# Patient Record
Sex: Female | Born: 1935 | Race: Black or African American | Hispanic: No
Health system: Midwestern US, Community
[De-identification: ages and names within clinical notes are randomized; demographics above are authoritative.]

## PROBLEM LIST (undated history)

## (undated) DIAGNOSIS — R4182 Altered mental status, unspecified: Secondary | ICD-10-CM

## (undated) DIAGNOSIS — E785 Hyperlipidemia, unspecified: Secondary | ICD-10-CM

## (undated) DIAGNOSIS — I10 Essential (primary) hypertension: Secondary | ICD-10-CM

## (undated) DIAGNOSIS — R921 Mammographic calcification found on diagnostic imaging of breast: Secondary | ICD-10-CM

## (undated) DIAGNOSIS — E119 Type 2 diabetes mellitus without complications: Secondary | ICD-10-CM

## (undated) DIAGNOSIS — H409 Unspecified glaucoma: Secondary | ICD-10-CM

## (undated) DIAGNOSIS — C50419 Malignant neoplasm of upper-outer quadrant of unspecified female breast: Principal | ICD-10-CM

## (undated) DIAGNOSIS — J189 Pneumonia, unspecified organism: Secondary | ICD-10-CM

## (undated) HISTORY — DX: Mammographic calcification found on diagnostic imaging of breast: R92.1

## (undated) HISTORY — DX: Hyperlipidemia, unspecified: E78.5

## (undated) HISTORY — PX: TONSILLECTOMY: SUR1361

## (undated) HISTORY — DX: Essential (primary) hypertension: I10

## (undated) HISTORY — DX: Malignant neoplasm of upper-outer quadrant of unspecified female breast: C50.419

---
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
## 1978-12-09 HISTORY — PX: BREAST BIOPSY: SHX20

## 1981-12-09 HISTORY — PX: ABDOMINAL HYSTERECTOMY: SHX81

## 2006-04-24 ENCOUNTER — Ambulatory Visit: Payer: Self-pay | Admitting: Orthopedic Surgery

## 2006-05-15 ENCOUNTER — Ambulatory Visit: Payer: Self-pay | Admitting: Orthopedic Surgery

## 2008-12-09 HISTORY — PX: ROTATOR CUFF REPAIR: SHX139

## 2009-06-29 ENCOUNTER — Ambulatory Visit: Payer: Self-pay | Admitting: Orthopedic Surgery

## 2009-06-29 DIAGNOSIS — E119 Type 2 diabetes mellitus without complications: Secondary | ICD-10-CM

## 2009-06-29 DIAGNOSIS — M719 Bursopathy, unspecified: Secondary | ICD-10-CM

## 2009-06-29 DIAGNOSIS — M25519 Pain in unspecified shoulder: Secondary | ICD-10-CM

## 2009-06-29 DIAGNOSIS — Z8679 Personal history of other diseases of the circulatory system: Secondary | ICD-10-CM | POA: Insufficient documentation

## 2009-06-29 DIAGNOSIS — M25529 Pain in unspecified elbow: Secondary | ICD-10-CM | POA: Insufficient documentation

## 2009-06-29 DIAGNOSIS — M67919 Unspecified disorder of synovium and tendon, unspecified shoulder: Secondary | ICD-10-CM | POA: Insufficient documentation

## 2009-06-30 ENCOUNTER — Encounter (INDEPENDENT_AMBULATORY_CARE_PROVIDER_SITE_OTHER): Payer: Self-pay | Admitting: *Deleted

## 2009-07-06 ENCOUNTER — Encounter: Payer: Self-pay | Admitting: Orthopedic Surgery

## 2009-07-20 ENCOUNTER — Ambulatory Visit: Payer: Self-pay | Admitting: Orthopedic Surgery

## 2009-07-26 ENCOUNTER — Telehealth: Payer: Self-pay | Admitting: Orthopedic Surgery

## 2009-08-04 ENCOUNTER — Ambulatory Visit (HOSPITAL_COMMUNITY): Admission: RE | Admit: 2009-08-04 | Discharge: 2009-08-05 | Payer: Self-pay | Admitting: Orthopedic Surgery

## 2009-08-04 ENCOUNTER — Ambulatory Visit: Payer: Self-pay | Admitting: Orthopedic Surgery

## 2009-08-08 ENCOUNTER — Ambulatory Visit: Payer: Self-pay | Admitting: Orthopedic Surgery

## 2009-08-09 ENCOUNTER — Encounter (INDEPENDENT_AMBULATORY_CARE_PROVIDER_SITE_OTHER): Payer: Self-pay | Admitting: *Deleted

## 2009-08-10 ENCOUNTER — Encounter: Payer: Self-pay | Admitting: Orthopedic Surgery

## 2009-08-15 ENCOUNTER — Ambulatory Visit: Payer: Self-pay | Admitting: Orthopedic Surgery

## 2009-09-13 ENCOUNTER — Ambulatory Visit: Payer: Self-pay | Admitting: Orthopedic Surgery

## 2009-09-21 ENCOUNTER — Encounter: Payer: Self-pay | Admitting: Orthopedic Surgery

## 2009-10-04 ENCOUNTER — Encounter: Payer: Self-pay | Admitting: Orthopedic Surgery

## 2009-10-31 ENCOUNTER — Ambulatory Visit: Payer: Self-pay | Admitting: Orthopedic Surgery

## 2009-11-16 ENCOUNTER — Encounter: Payer: Self-pay | Admitting: Orthopedic Surgery

## 2010-01-12 ENCOUNTER — Encounter: Payer: Self-pay | Admitting: Orthopedic Surgery

## 2011-01-08 NOTE — Letter (Signed)
Summary: Medical records request [facility]  Medical records request [facility]   Imported By: Cammie Sickle 01/16/2010 16:46:59  _____________________________________________________________________  External Attachment:    Type:   Image     Comment:   External Document

## 2011-03-16 LAB — GLUCOSE, CAPILLARY
Glucose-Capillary: 126 mg/dL — ABNORMAL HIGH (ref 70–99)
Glucose-Capillary: 130 mg/dL — ABNORMAL HIGH (ref 70–99)
Glucose-Capillary: 77 mg/dL (ref 70–99)

## 2011-03-16 LAB — BASIC METABOLIC PANEL
BUN: 12 mg/dL (ref 6–23)
Calcium: 9.6 mg/dL (ref 8.4–10.5)
GFR calc non Af Amer: >60 mL/min (ref >60)
Potassium: 3.9 mEq/L (ref 3.5–5.1)

## 2011-03-16 LAB — HEMOGLOBIN AND HEMATOCRIT, BLOOD: Hemoglobin: 13.1 g/dL (ref 12.0–15.0)

## 2011-04-23 NOTE — Op Note (Signed)
NAME:  [name], [name]                  ACCOUNT NO.:  [id number]   MEDICAL RECORD NO.:  [id number]          PATIENT TYPE:  OIB   LOCATION:  A331                          FACILITY:  [facility]   PHYSICIAN:  Vickki Hearing, M.D.DATE OF BIRTH:  01/05/1936   DATE OF PROCEDURE:  08/04/2009  DATE OF DISCHARGE:                               OPERATIVE REPORT   HISTORY:  [name] is a 75 year old.  She injured her shoulder on July  16, while on vacation in [place], she fell.  She presented to the office  with pain in her left shoulder.  She could not move her arm, had weak  supraspinatus tendon function.  She went for MRI showed a full-thickness  supraspinatus tendon tear.  We recommended surgery versus continued  nonoperative treatment.  We did try an injection as well which did not  help.   PREOPERATIVE DIAGNOSIS:  Left shoulder rotator cuff tear.   POSTOPERATIVE DIAGNOSIS:  Left shoulder rotator cuff tear.   PROCEDURE:  Open rotator cuff repair.   SURGEON:  Vickki Hearing, MD   ASSISTED:  Hartshorne Nation.   ANESTHESIA:  General.   OPERATIVE FINDINGS:  Supraspinatus tendon was torn.  The size was 2 cm.  The retraction was 1 cm.   Implants used two 6/5 suture anchors and one 4/5 push lock anchor.   DETAILS OF PROCEDURE:  The patient was identified in the preop holding  area.  Surgical site marking was performed.  Chart was updated.  The  patient was taken to surgery, given vancomycin which was started in  preop.  She underwent general anesthesia successfully.  She was placed  in the modified beach-chair position.  Her left arm was prepped with  chlorhexidine and draped sterilely.  Time-out procedure was completed.   An incision was made over the anterolateral acromion taken down to the  deltoid fascia.  The deltoid fascia was split in line with the skin  incision and then the split was carried up subperiosteally over the  acromion.  There was a loose piece of anterior acromion  which was  removed and then acromioplasty was performed.   A bursectomy was then performed.  The tendon was encountered.  It was  very mobile, not scarred down of all.  Two traction sutures were placed.  We then burred the articular margin junction with the greater tuberosity  as well as the greater tuberosity.  Then placed two 6/5 suture anchors,  removed one of the sutures, passed two sutures in a horizontal mattress  fashion, and then passed one push lock anchor with all 4 sutures in the  push lock.  This gave an excellent repair with no tension.  The repair  was done with the arm at the side.  The wound was then irrigated and  closed with #2 Ethibond to close the deltoid occluding a reattachment to  the acromion and then 0 Monocryl and  subcu running 3-0 nylon.  Pain pump catheter was placed.  Subacromial  injection of Marcaine was done.  Steri-Strips were applied.  Sterile  dressing cryo cuff.  The patient was extubated and taken to recovery  room in stable condition.  She will be admitted for pain control.      Vickki Hearing, M.D.  Electronically Signed     SEH/MEDQ  D:  08/04/2009  T:  08/04/2009  Job:  [id number]

## 2011-04-23 NOTE — Discharge Summary (Signed)
NAMEBREYANNA, [name]                  ACCOUNT NO.:  [id number]   MEDICAL RECORD NO.:  [id number]          PATIENT TYPE:  OIB   LOCATION:  A331                          FACILITY:  [facility]   PHYSICIAN:  Vickki Hearing, M.D.DATE OF BIRTH:  01/05/1936   DATE OF ADMISSION:  08/04/2009  DATE OF DISCHARGE:  08/28/2010LH                               DISCHARGE SUMMARY   ADMITTING DIAGNOSIS:  Left rotator cuff tear.   DISCHARGE DIAGNOSIS:  Left rotator cuff tear.   ADMITTING PHYSICIAN:  Vickki Hearing, MD   DISCHARGING PHYSICIAN:  Vickki Hearing, MD   OPERATIVE PROCEDURE:  Open rotator cuff repair, left shoulder.   SURGEON:  Vickki Hearing, MD   OPERATIVE FINDINGS:  Complete supraspinatus tendon tear 2 cm in size and  1-cm retracted.   The patient was admitted on the 27th for same-day surgery, had the  surgery, did well, was admitted to the floor for pain control, did well  with that, and was discharged home with following medications:  1. Celebrex 200 mg daily.  2. Vasotec 10 mg daily.  3. Zetia 10 mg daily.  4. Xalatan eye solution 1 drop both eyes nightly.  5. Glucophage 500 mg p.o. daily w.c.  6. One therapeutic multivitamin.  7. Actos 45 mg daily.  8. Maxzide 1 tablet daily.  9. Robaxin 500 mg p.o. q.6 p.r.n. pain.  10.Darvocet 1 tablet q.4 p.r.n. pain.   ALLERGIES:  Allergic to AMOXICILLIN.   The patient will be discharged in a cryo cuff with a pain pump catheter  in place.  The patient will follow with me on Tuesday.   OVERALL CONDITION:  Stable.   DISPOSITION:  To home.      Vickki Hearing, M.D.  Electronically Signed     SEH/MEDQ  D:  08/05/2009  T:  08/05/2009  Job:  [id number]

## 2011-08-13 ENCOUNTER — Other Ambulatory Visit: Payer: Self-pay | Admitting: Internal Medicine

## 2011-08-13 DIAGNOSIS — R921 Mammographic calcification found on diagnostic imaging of breast: Secondary | ICD-10-CM

## 2011-08-14 ENCOUNTER — Other Ambulatory Visit: Payer: Self-pay | Admitting: Internal Medicine

## 2011-08-15 ENCOUNTER — Other Ambulatory Visit: Payer: Self-pay | Admitting: Internal Medicine

## 2011-08-15 DIAGNOSIS — N63 Unspecified lump in unspecified breast: Secondary | ICD-10-CM

## 2011-08-21 ENCOUNTER — Ambulatory Visit
Admission: RE | Admit: 2011-08-21 | Discharge: 2011-08-21 | Disposition: A | Discharge status: Home / Self Care | Payer: Medicare Other | Source: Ambulatory Visit | Attending: Internal Medicine | Admitting: Internal Medicine

## 2011-08-21 ENCOUNTER — Other Ambulatory Visit: Payer: Self-pay | Admitting: Internal Medicine

## 2011-08-21 DIAGNOSIS — N63 Unspecified lump in unspecified breast: Secondary | ICD-10-CM

## 2011-08-21 DIAGNOSIS — R921 Mammographic calcification found on diagnostic imaging of breast: Secondary | ICD-10-CM

## 2011-08-26 ENCOUNTER — Ambulatory Visit
Admission: RE | Admit: 2011-08-26 | Discharge: 2011-08-26 | Disposition: A | Discharge status: Home / Self Care | Payer: Medicare Other | Source: Ambulatory Visit | Attending: Internal Medicine | Admitting: Internal Medicine

## 2011-08-26 ENCOUNTER — Other Ambulatory Visit: Payer: Self-pay | Admitting: Internal Medicine

## 2011-08-26 DIAGNOSIS — R921 Mammographic calcification found on diagnostic imaging of breast: Secondary | ICD-10-CM

## 2011-09-05 ENCOUNTER — Ambulatory Visit (INDEPENDENT_AMBULATORY_CARE_PROVIDER_SITE_OTHER): Payer: Self-pay | Admitting: Surgery

## 2011-09-05 ENCOUNTER — Telehealth (INDEPENDENT_AMBULATORY_CARE_PROVIDER_SITE_OTHER): Payer: Self-pay | Admitting: General Surgery

## 2011-09-05 ENCOUNTER — Encounter (INDEPENDENT_AMBULATORY_CARE_PROVIDER_SITE_OTHER): Payer: Self-pay | Admitting: Surgery

## 2011-09-05 NOTE — Telephone Encounter (Signed)
Pt called office under the impression she was having a punch biopsy performed at 9/28 appt. Advised pt that was not the appointment scheduled. Pt stated she received a call and it may have been the breast center upstairs on 4th floor. Advised pt her appt was an evaluation only.

## 2011-09-06 ENCOUNTER — Ambulatory Visit (INDEPENDENT_AMBULATORY_CARE_PROVIDER_SITE_OTHER): Payer: Medicare Other | Admitting: Surgery

## 2011-09-06 ENCOUNTER — Other Ambulatory Visit (INDEPENDENT_AMBULATORY_CARE_PROVIDER_SITE_OTHER): Payer: Self-pay | Admitting: Surgery

## 2011-09-06 ENCOUNTER — Encounter (INDEPENDENT_AMBULATORY_CARE_PROVIDER_SITE_OTHER): Payer: Self-pay | Admitting: Surgery

## 2011-09-06 VITALS — BP 134/86 | HR 60 | Temp 97.3°F | Resp 16 | Ht 61.5 in | Wt 207.4 lb

## 2011-09-06 DIAGNOSIS — R921 Mammographic calcification found on diagnostic imaging of breast: Secondary | ICD-10-CM

## 2011-09-06 DIAGNOSIS — R928 Other abnormal and inconclusive findings on diagnostic imaging of breast: Secondary | ICD-10-CM

## 2011-09-06 NOTE — Progress Notes (Signed)
Chief Complaint  Patient presents with  . Other    new pt- eval rt br calcifications    HPI [name] is a 75 y.o. female.  She was found to have some breast calcifications in the right breast. A recent followup mammogram showed some change in these calcifications. A core biopsy was attempted, but unsuccessful because the calcifications were too far posterior. Surgical excision was recommended.  The patient is having no breast symptoms whatsoever. Around 1980 she had a surgical biopsy in the right breast for some benign process. She does not recall any of the details. HPI  Past Medical History  Diagnosis Date  . Hypertension   . Hyperlipidemia   . Breast calcifications     right    Past Surgical History  Procedure Date  . Appendectomy 1954  . Abdominal hysterectomy 1983  . Rotator cuff repair 2010    left  . Breast biopsy 1980    right breast    Family History  Problem Relation Age of Onset  . Stroke Mother   . Heart disease Father   . Cancer Sister     breast    Social History History  Substance Use Topics  . Smoking status: Never Smoker   . Smokeless tobacco: Not on file  . Alcohol Use: No    Allergies  Allergen Reactions  . Amoxicillin     Current Outpatient Prescriptions  Medication Sig Dispense Refill  . aspirin (BAYER ASPIRIN) 325 MG tablet Take 325 mg by mouth daily.        . bimatoprost (LUMIGAN) 0.01 % SOLN 1 drop at bedtime.        . enalapril (VASOTEC) 10 MG tablet Take 10 mg by mouth daily.        . pioglitazone (ACTOS) 45 MG tablet Take 45 mg by mouth daily.        . rosuvastatin (CRESTOR) 40 MG tablet Take 40 mg by mouth daily.        Marland Kitchen triamterene-hydrochlorothiazide (DYAZIDE) 37.5-25 MG per capsule Take 1 capsule by mouth every morning.          Review of Systems Review of Systems She has filled out our 12 point review of systems and it is negative.She does have HBP, Hi Chol, and eye problems   Blood pressure 134/86, pulse 60,  temperature 97.3 F (36.3 C), temperature source Temporal, resp. rate 16, height 5' 1.5" (1.562 m), weight 207 lb 6.4 oz (94.076 kg).  Physical Exam Physical Exam GENERAL:  The patient is alert, oriented, and generally healthy-appearing, NAD. Mood and affect are normal.  HEENT:  The head is normocephalic, the eyes nonicteric, the pupils were round regular and equal. EOMs are normal. Pharynx normal. Dentition good.  NECK:  The neck is supple and there are no masses or thyromegaly.  LUNGS: Normal respirations and clear to auscultation.  HEART: Regular rhythm, with no murmurs rubs or gallops. Pulses are intact carotid dorsalis pedis and posterior tibial. No significant varicosities are noted.  BREASTS:  The breasts are symmetric in appearance. There is no palpable mass on either side. The skin nipple and areolar areas are normal. There is a well-healed surgical scar in the right breast upper outer quadrant. There is nothing that would suggest a mass.  LYMPHATICS: There is no axillary or supraclavicular adenopathy.  ABDOMEN: Soft, flat, and nontender. No masses or organomegaly is noted. No hernias are noted. Bowel sounds are normal.  EXTREMITIES:  Good range of motion, no edema.  Data Reviewed I have reviewed the mammogram films and reports. I've also looked at her other medical history information.  Assessment    Indeterminate right breast calcifications    Plan    NL Excisional biopsy. I have discussed indications risks and complications of the surgery with the patient and her daughter and son. All questions have been answered. They would like to go ahead and schedule surgery.        [name] J 09/06/2011, 11:33 AM

## 2011-09-06 NOTE — Patient Instructions (Signed)
We will schedule surgery for the removal of the breast calcifications that were found on your recent mammogram.  You will need to have a guidewire placed the morning of surgery so we can be able to find these calcifications.  Her surgery will be done as an outpatient.  You Need to Stop Your Aspirin Five Days before Surgery. You May Resume It the Day after Surgery.

## 2011-09-23 ENCOUNTER — Telehealth (INDEPENDENT_AMBULATORY_CARE_PROVIDER_SITE_OTHER): Payer: Self-pay | Admitting: General Surgery

## 2011-09-23 ENCOUNTER — Other Ambulatory Visit (INDEPENDENT_AMBULATORY_CARE_PROVIDER_SITE_OTHER): Payer: Self-pay | Admitting: Surgery

## 2011-09-23 ENCOUNTER — Encounter (HOSPITAL_COMMUNITY)
Admission: RE | Admit: 2011-09-23 | Discharge: 2011-09-23 | Disposition: A | Discharge status: Home / Self Care | Payer: Medicare Other | Source: Ambulatory Visit | Attending: Surgery | Admitting: Surgery

## 2011-09-23 DIAGNOSIS — R921 Mammographic calcification found on diagnostic imaging of breast: Secondary | ICD-10-CM

## 2011-09-23 LAB — BASIC METABOLIC PANEL
Calcium: 10.6 mg/dL — ABNORMAL HIGH (ref 8.4–10.5)
Creatinine, Ser: 0.57 mg/dL (ref 0.50–1.10)
GFR calc non Af Amer: 90 mL/min — ABNORMAL LOW (ref >90)
Sodium: 140 mEq/L (ref 135–145)

## 2011-09-23 LAB — SURGICAL PCR SCREEN
MRSA, PCR: NEGATIVE
Staphylococcus aureus: NEGATIVE

## 2011-09-23 LAB — CBC
MCH: 31.2 pg (ref 26.0–34.0)
MCHC: 33.7 g/dL (ref 30.0–36.0)
MCV: 92.4 fL (ref 78.0–100.0)
Platelets: 295 10*3/uL (ref 150–400)
RDW: 14 % (ref 11.5–15.5)

## 2011-09-23 NOTE — Telephone Encounter (Signed)
Labs okay for surgery faxed to pre-op.  

## 2011-09-23 NOTE — Telephone Encounter (Signed)
Message copied by Liliana Cline on Mon Sep 23, 2011  1:35 PM ------      Message from: Currie Paris      Created: Mon Sep 23, 2011 12:39 PM       These labs are OK for surgery

## 2011-09-24 ENCOUNTER — Ambulatory Visit
Admission: RE | Admit: 2011-09-24 | Discharge: 2011-09-24 | Disposition: A | Discharge status: Home / Self Care | Payer: Medicare Other | Source: Ambulatory Visit | Attending: Surgery | Admitting: Surgery

## 2011-09-24 ENCOUNTER — Encounter (INDEPENDENT_AMBULATORY_CARE_PROVIDER_SITE_OTHER): Payer: Self-pay | Admitting: Surgery

## 2011-09-24 ENCOUNTER — Ambulatory Visit (HOSPITAL_COMMUNITY)
Admission: RE | Admit: 2011-09-24 | Discharge: 2011-09-24 | Disposition: A | Discharge status: Home / Self Care | Payer: Medicare Other | Source: Ambulatory Visit | Attending: Surgery | Admitting: Surgery

## 2011-09-24 ENCOUNTER — Other Ambulatory Visit (INDEPENDENT_AMBULATORY_CARE_PROVIDER_SITE_OTHER): Payer: Self-pay | Admitting: Surgery

## 2011-09-24 DIAGNOSIS — Z01812 Encounter for preprocedural laboratory examination: Secondary | ICD-10-CM | POA: Insufficient documentation

## 2011-09-24 DIAGNOSIS — C50419 Malignant neoplasm of upper-outer quadrant of unspecified female breast: Secondary | ICD-10-CM

## 2011-09-24 DIAGNOSIS — R921 Mammographic calcification found on diagnostic imaging of breast: Secondary | ICD-10-CM

## 2011-09-24 DIAGNOSIS — I10 Essential (primary) hypertension: Secondary | ICD-10-CM | POA: Insufficient documentation

## 2011-09-24 DIAGNOSIS — C50919 Malignant neoplasm of unspecified site of unspecified female breast: Secondary | ICD-10-CM | POA: Insufficient documentation

## 2011-09-24 DIAGNOSIS — Z01810 Encounter for preprocedural cardiovascular examination: Secondary | ICD-10-CM | POA: Insufficient documentation

## 2011-09-24 DIAGNOSIS — E119 Type 2 diabetes mellitus without complications: Secondary | ICD-10-CM | POA: Insufficient documentation

## 2011-09-24 DIAGNOSIS — Z01818 Encounter for other preprocedural examination: Secondary | ICD-10-CM | POA: Insufficient documentation

## 2011-09-24 DIAGNOSIS — D0590 Unspecified type of carcinoma in situ of unspecified breast: Secondary | ICD-10-CM

## 2011-09-24 HISTORY — DX: Malignant neoplasm of upper-outer quadrant of unspecified female breast: C50.419

## 2011-09-24 HISTORY — PX: BREAST LUMPECTOMY W/ NEEDLE LOCALIZATION: SHX1266

## 2011-09-24 NOTE — Op Note (Signed)
  [name], [name]                  ACCOUNT NO.:  [id number]  MEDICAL RECORD NO.:  [id number]  LOCATION:  SDSC                         FACILITY:  [facility]  PHYSICIAN:  Currie Paris, M.D.DATE OF BIRTH:  01/05/1936  DATE OF PROCEDURE:  09/24/2011 DATE OF DISCHARGE:                              OPERATIVE REPORT   PREOPERATIVE DIAGNOSIS:  Right breast calcifications not amenable to core biopsy.  POSTOPERATIVE DIAGNOSIS:  Right breast calcifications not amenable to core biopsy.  PROCEDURE:  Needle-guided excision of right breast calcifications.  SURGEON:  Currie Paris, MD  ASSISTANT:  Marveen Reeks, PA student.  ANESTHESIA:  General.  CLINICAL HISTORY:  This is a 75 year old lady with some moderately suspicious right breast calcifications fairly deep and lateral in the right breast and not amenable to a core biopsy.  Elective excisional biopsy using guidewire localization technique was recommended.  DESCRIPTION OF PROCEDURE:  The patient was seen in the holding area. She had no further questions.  We confirmed the surgery as noted above. I initialed the right breast as the operative side.  I reviewed the localizing films and there were 2 guidewires going from lateral to medial and they entered fairly far posteriorly.  The patient was then taken to the operating room.  After satisfactory general anesthesia had been obtained, the breast was prepped and draped and the time-out was done.  I made a transverse incision starting medial to the more medial or more anterior of the 2 guidewires and extending to the more posterior guidewire.  I divided about a centimeter of subcutaneous tissue and then grasped the tract of the guidewire with 2 Allis clamps and took a wide excision around them.  As I got to where I thought I was beyond both of the wires, I start to come down from anterior to posterior, but saw that I had not gotten completely past the more anterior of the 2  wires.  I therefore re-grasped the tissue and took some additional tissue to beyond the tip of the guidewire.  I did have to switch the specimen together to make sure that where I had initially made a tentative decision to separate the specimen from the breast did not appear to be a false margin.  I then inked the specimen, we did special mammography and the calcifications appeared to be contained within the specimen.  I then spent several minutes irrigating cauterizing and suturing bleeders to make sure everything was dry.  I put some Marcaine to help with postop analgesia.  I closed in layers with 3-0 Vicryl, 4-0 Monocryl, subcuticular plus Dermabond.  The patient tolerated the procedure well.  There were no complications. All counts were correct.     Currie Paris, M.D.     CJS/MEDQ  D:  09/24/2011  T:  09/24/2011  Job:  [id number]  Electronically Signed by Cyndia Bent M.D. on 09/24/2011 08:21:26 PM

## 2011-09-27 ENCOUNTER — Telehealth (INDEPENDENT_AMBULATORY_CARE_PROVIDER_SITE_OTHER): Payer: Self-pay | Admitting: General Surgery

## 2011-09-27 NOTE — Telephone Encounter (Signed)
Patient daughter called looking for path results. Made her aware this has not been reviewed by Dr Jamey Ripa and we would call once this has been reviewed.

## 2011-09-30 ENCOUNTER — Telehealth (INDEPENDENT_AMBULATORY_CARE_PROVIDER_SITE_OTHER): Payer: Self-pay | Admitting: Surgery

## 2011-09-30 NOTE — Telephone Encounter (Signed)
Reviewed her path with her today and told her we would see about getting oncology appointments set up for her in [place].

## 2011-10-01 ENCOUNTER — Telehealth (INDEPENDENT_AMBULATORY_CARE_PROVIDER_SITE_OTHER): Payer: Self-pay | Admitting: General Surgery

## 2011-10-01 DIAGNOSIS — C50919 Malignant neoplasm of unspecified site of unspecified female breast: Secondary | ICD-10-CM

## 2011-10-01 NOTE — Telephone Encounter (Signed)
Message copied by Liliana Cline on Tue Oct 01, 2011 11:38 AM ------      Message from: Currie Paris      Created: Tue Oct 01, 2011 10:36 AM       She will need a referral sent to the cancer center for med and rad oncology app'ts . Se wants to be seen in [place] and needs morning appointment time

## 2011-10-10 ENCOUNTER — Ambulatory Visit (INDEPENDENT_AMBULATORY_CARE_PROVIDER_SITE_OTHER): Payer: Medicare Other | Admitting: Surgery

## 2011-10-10 ENCOUNTER — Encounter (HOSPITAL_BASED_OUTPATIENT_CLINIC_OR_DEPARTMENT_OTHER): Payer: Self-pay | Admitting: *Deleted

## 2011-10-10 ENCOUNTER — Encounter (INDEPENDENT_AMBULATORY_CARE_PROVIDER_SITE_OTHER): Payer: Self-pay | Admitting: Surgery

## 2011-10-10 VITALS — BP 122/70 | HR 76 | Temp 97.2°F | Resp 20 | Ht 61.5 in | Wt 207.0 lb

## 2011-10-10 DIAGNOSIS — C50419 Malignant neoplasm of upper-outer quadrant of unspecified female breast: Secondary | ICD-10-CM

## 2011-10-10 NOTE — Patient Instructions (Signed)
We will schedule you for another excision of the area of your breast cancer and appointments in [place] with a medical and radiation oncologist

## 2011-10-10 NOTE — Progress Notes (Signed)
ALL NOTES BY C RATHBONE RN  DONE UNDER Burns Spain RN

## 2011-10-10 NOTE — Progress Notes (Signed)
NAME: [name]                                            DOB: 01/05/1936 DATE: 10/10/2011                                                  MRN: [id number]  CC: post op excisional breast Bx  HPI: This patient comes in for post op follow-up.Sheunderwent NL excision of right breast calcifications on 09/24/2011. She feels that she is doing well.  PE: General: The patient appears to be healthy, NAD Incision healing nicely - no infection or hematoma  DATA REVIEWED: Path shows IDC, receptor positve with 1 cm marign, but associated DCIS with 1mm medial margin  IMPRESSION: The patient is doing well S/P NL lumpectom.    PLAN: She will need re-excision for margin. I went over the path report with her and family. She was presented at the breast conference and no SLN indicated, possibly no radiation depending on final path

## 2011-10-13 NOTE — H&P (Signed)
Progress Notes [name] (MR# [id number])         Progress Notes Info       Author  Note Status  Last Update User  Last Update Date/Time    Currie Paris, MD  Signed  Currie Paris, MD  10/10/2011 12:36 PM          Progress Notes     NAME: [name] DOB: 01/05/1936  DATE: 10/10/2011 MRN: [id number]  CC: post op excisional breast Bx  HPI:  This patient comes in for post op follow-up.Sheunderwent NL excision of right breast calcifications on 09/24/2011. She feels that she is doing well.  PE:  General: The patient appears to be healthy, NAD  Incision healing nicely - no infection or hematoma  DATA REVIEWED:  Path shows IDC, receptor positve with 1 cm marign, but associated DCIS with 1mm medial margin  IMPRESSION:  The patient is doing well S/P NL lumpectom.  PLAN:  She will need re-excision for margin. I went over the path report with her and family. She was presented at the breast conference and no SLN indicated, possibly no radiation depending on final path          Progress Notes   [name] (MR# [id number])       Progress Notes Info       Author Note Status Last Update User Last Update Date/Time    Currie Paris, MD Signed Currie Paris, MD 09/06/2011 11:38 AM      Progress Notes     Chief Complaint   Patient presents with   .  Other       new pt- eval rt br calcifications        HPI [name] is a 75 y.o. female.  She was found to have some breast calcifications in the right breast. A recent followup mammogram showed some change in these calcifications. A core biopsy was attempted, but unsuccessful because the calcifications were too far posterior. Surgical excision was recommended.   The patient is having no breast symptoms whatsoever. Around 1980 she had a surgical biopsy in the right breast for some benign process. She does not recall any of the details. HPI    Past Medical History   Diagnosis  Date     .  Hypertension     .  Hyperlipidemia     .  Breast calcifications         right         Past Surgical History   Procedure  Date   .  Appendectomy  1954   .  Abdominal hysterectomy  1983   .  Rotator cuff repair  2010       left   .  Breast biopsy  1980       right breast         Family History   Problem  Relation  Age of Onset   .  Stroke  Mother     .  Heart disease  Father     .  Cancer  Sister         breast        Social History History   Substance Use Topics   .  Smoking status:  Never Smoker    .  Smokeless tobacco:  Not on file   .  Alcohol Use:  No  Allergies   Allergen  Reactions   .  Amoxicillin           Current Outpatient Prescriptions   Medication  Sig  Dispense  Refill   .  aspirin (BAYER ASPIRIN) 325 MG tablet  Take 325 mg by mouth daily.           .  bimatoprost (LUMIGAN) 0.01 % SOLN  1 drop at bedtime.           .  enalapril (VASOTEC) 10 MG tablet  Take 10 mg by mouth daily.           .  pioglitazone (ACTOS) 45 MG tablet  Take 45 mg by mouth daily.           .  rosuvastatin (CRESTOR) 40 MG tablet  Take 40 mg by mouth daily.           Marland Kitchen  triamterene-hydrochlorothiazide (DYAZIDE) 37.5-25 MG per capsule  Take 1 capsule by mouth every morning.                Review of Systems Review of Systems She has filled out our 12 point review of systems and it is negative.She does have HBP, Hi Chol, and eye problems     Blood pressure 134/86, pulse 60, temperature 97.3 F (36.3 C), temperature source Temporal, resp. rate 16, height 5' 1.5" (1.562 m), weight 207 lb 6.4 oz (94.076 kg).   Physical Exam Physical Exam GENERAL:   The patient is alert, oriented, and generally healthy-appearing, NAD. Mood and affect are normal.   HEENT:   The head is normocephalic, the eyes nonicteric, the pupils were round regular and equal. EOMs are normal. Pharynx normal. Dentition good.   NECK:   The neck is supple and there are no masses or  thyromegaly.   LUNGS: Normal respirations and clear to auscultation.   HEART: Regular rhythm, with no murmurs rubs or gallops. Pulses are intact carotid dorsalis pedis and posterior tibial. No significant varicosities are noted.   BREASTS:  The breasts are symmetric in appearance. There is no palpable mass on either side. The skin nipple and areolar areas are normal. There is a well-healed surgical scar in the right breast upper outer quadrant. There is nothing that would suggest a mass.   LYMPHATICS: There is no axillary or supraclavicular adenopathy.   ABDOMEN: Soft, flat, and nontender. No masses or organomegaly is noted. No hernias are noted. Bowel sounds are normal.   EXTREMITIES:   Good range of motion, no edema.     Data Reviewed I have reviewed the mammogram films and reports. I've also looked at her other medical history information.   Assessment    Indeterminate right breast calcifications     Plan    NL Excisional biopsy. I have discussed indications risks and complications of the surgery with the patient and her daughter and son. All questions have been answered. They would like to go ahead and schedule surgery.            Sunni Richardson J 09/06/2011, 11:33 AM

## 2011-10-14 ENCOUNTER — Encounter (HOSPITAL_BASED_OUTPATIENT_CLINIC_OR_DEPARTMENT_OTHER): Payer: Self-pay | Admitting: Anesthesiology

## 2011-10-14 ENCOUNTER — Encounter (HOSPITAL_BASED_OUTPATIENT_CLINIC_OR_DEPARTMENT_OTHER): Payer: Self-pay | Admitting: *Deleted

## 2011-10-14 ENCOUNTER — Encounter (HOSPITAL_BASED_OUTPATIENT_CLINIC_OR_DEPARTMENT_OTHER): Payer: Self-pay

## 2011-10-14 ENCOUNTER — Ambulatory Visit (HOSPITAL_BASED_OUTPATIENT_CLINIC_OR_DEPARTMENT_OTHER)
Admission: RE | Admit: 2011-10-14 | Discharge: 2011-10-14 | Disposition: A | Discharge status: Home / Self Care | Payer: Medicare Other | Source: Ambulatory Visit | Attending: Surgery | Admitting: Surgery

## 2011-10-14 ENCOUNTER — Encounter (HOSPITAL_BASED_OUTPATIENT_CLINIC_OR_DEPARTMENT_OTHER)
Admission: RE | Disposition: A | Discharge status: Home / Self Care | Payer: Self-pay | Source: Ambulatory Visit | Attending: Surgery

## 2011-10-14 ENCOUNTER — Ambulatory Visit (HOSPITAL_BASED_OUTPATIENT_CLINIC_OR_DEPARTMENT_OTHER): Payer: Medicare Other | Admitting: Anesthesiology

## 2011-10-14 ENCOUNTER — Other Ambulatory Visit (INDEPENDENT_AMBULATORY_CARE_PROVIDER_SITE_OTHER): Payer: Self-pay | Admitting: Surgery

## 2011-10-14 DIAGNOSIS — R920 Mammographic microcalcification found on diagnostic imaging of breast: Secondary | ICD-10-CM | POA: Insufficient documentation

## 2011-10-14 DIAGNOSIS — E119 Type 2 diabetes mellitus without complications: Secondary | ICD-10-CM | POA: Insufficient documentation

## 2011-10-14 DIAGNOSIS — C50919 Malignant neoplasm of unspecified site of unspecified female breast: Secondary | ICD-10-CM

## 2011-10-14 DIAGNOSIS — I10 Essential (primary) hypertension: Secondary | ICD-10-CM | POA: Insufficient documentation

## 2011-10-14 HISTORY — PX: BREAST CYST EXCISION: SHX579

## 2011-10-14 HISTORY — DX: Pneumonia, unspecified organism: J18.9

## 2011-10-14 LAB — POCT I-STAT, CHEM 8
BUN: 10 mg/dL (ref 6–23)
Creatinine, Ser: 0.7 mg/dL (ref 0.50–1.10)
Potassium: 3.7 mEq/L (ref 3.5–5.1)
Sodium: 140 mEq/L (ref 135–145)

## 2011-10-14 SURGERY — EXCISION, CYST, BREAST
Anesthesia: General | Laterality: Right

## 2011-10-14 MED ORDER — FENTANYL CITRATE 0.05 MG/ML IJ SOLN: 25.0000 ug | INTRAMUSCULAR | Status: DC | PRN

## 2011-10-14 MED ORDER — PROPOFOL 10 MG/ML IV EMUL
INTRAVENOUS | Status: DC | PRN
  Administered 2011-10-14: 200 mg via INTRAVENOUS

## 2011-10-14 MED ORDER — IBUPROFEN 200 MG PO TABS: 200.0000 mg | ORAL_TABLET | Freq: Four times a day (QID) | ORAL | Status: DC | PRN

## 2011-10-14 MED ORDER — GLYCOPYRROLATE 0.2 MG/ML IJ SOLN: 0.2000 mg | Freq: Once | INTRAMUSCULAR | Status: DC | PRN

## 2011-10-14 MED ORDER — CIPROFLOXACIN IN D5W 400 MG/200ML IV SOLN
400.0000 mg | INTRAVENOUS | Status: AC
  Administered 2011-10-14: 400 mg via INTRAVENOUS

## 2011-10-14 MED ORDER — MIDAZOLAM HCL 2 MG/ML PO SYRP: 0.5000 mg/kg | ORAL_SOLUTION | Freq: Once | ORAL | Status: DC

## 2011-10-14 MED ORDER — EPHEDRINE SULFATE 50 MG/ML IJ SOLN
INTRAMUSCULAR | Status: DC | PRN
  Administered 2011-10-14: 7.5 mg via INTRAVENOUS

## 2011-10-14 MED ORDER — LACTATED RINGERS IV SOLN
INTRAVENOUS | Status: DC | PRN
  Administered 2011-10-14 (×2): via INTRAVENOUS

## 2011-10-14 MED ORDER — MORPHINE SULFATE 2 MG/ML IJ SOLN: 0.0500 mg/kg | INTRAMUSCULAR | Status: DC | PRN

## 2011-10-14 MED ORDER — LACTATED RINGERS IV SOLN: 500.0000 mL | INTRAVENOUS | Status: DC

## 2011-10-14 MED ORDER — MIDAZOLAM HCL 2 MG/2ML IJ SOLN: 0.5000 mg | INTRAMUSCULAR | Status: DC | PRN

## 2011-10-14 MED ORDER — ONDANSETRON HCL 4 MG/2ML IJ SOLN
INTRAMUSCULAR | Status: DC | PRN
  Administered 2011-10-14: 4 mg via INTRAVENOUS

## 2011-10-14 MED ORDER — FENTANYL CITRATE 0.05 MG/ML IJ SOLN
INTRAMUSCULAR | Status: DC | PRN
  Administered 2011-10-14: 25 ug via INTRAVENOUS
  Administered 2011-10-14: 50 ug via INTRAVENOUS

## 2011-10-14 MED ORDER — ATROPINE SULFATE 0.4 MG/ML IJ SOLN: 0.4000 mg | Freq: Once | INTRAMUSCULAR | Status: DC | PRN

## 2011-10-14 MED ORDER — BUPIVACAINE HCL (PF) 0.25 % IJ SOLN
INTRAMUSCULAR | Status: DC | PRN
  Administered 2011-10-14: 30 mL

## 2011-10-14 MED ORDER — KETOROLAC TROMETHAMINE 30 MG/ML IJ SOLN: 15.0000 mg | Freq: Once | INTRAMUSCULAR | Status: DC | PRN

## 2011-10-14 MED ORDER — SODIUM CHLORIDE 0.9 % IV SOLN: 0.1000 mg/kg | Freq: Once | INTRAVENOUS | Status: DC | PRN

## 2011-10-14 MED ORDER — METOCLOPRAMIDE HCL 5 MG/ML IJ SOLN: 10.0000 mg | Freq: Once | INTRAMUSCULAR | Status: DC | PRN

## 2011-10-14 MED ORDER — LIDOCAINE HCL (CARDIAC) 20 MG/ML IV SOLN
INTRAVENOUS | Status: DC | PRN
  Administered 2011-10-14: 100 mg via INTRAVENOUS

## 2011-10-14 MED ORDER — MIDAZOLAM HCL 2 MG/2ML IJ SOLN: 1.0000 mg | INTRAMUSCULAR | Status: DC | PRN

## 2011-10-14 MED ORDER — LACTATED RINGERS IV SOLN
INTRAVENOUS | Status: DC
  Administered 2011-10-14: 1000 mL via INTRAVENOUS

## 2011-10-14 MED ORDER — LIDOCAINE-PRILOCAINE 2.5-2.5 % EX CREA: 1.0000 "application " | TOPICAL_CREAM | Freq: Once | CUTANEOUS | Status: DC

## 2011-10-14 MED ORDER — DEXAMETHASONE SODIUM PHOSPHATE 4 MG/ML IJ SOLN
INTRAMUSCULAR | Status: DC | PRN
  Administered 2011-10-14: 4 mg via INTRAVENOUS

## 2011-10-14 MED ORDER — ACETAMINOPHEN 10 MG/ML IV SOLN
1000.0000 mg | Freq: Once | INTRAVENOUS | Status: AC
  Administered 2011-10-14: 1000 mg via INTRAVENOUS

## 2011-10-14 MED ORDER — ACETAMINOPHEN 100 MG/ML PO SOLN: 15.0000 mg/kg | ORAL | Status: DC | PRN

## 2011-10-14 MED ORDER — OXYMETAZOLINE HCL 0.05 % NA SOLN: 2.0000 | Freq: Once | NASAL | Status: DC

## 2011-10-14 MED ORDER — LACTATED RINGERS IV SOLN: INTRAVENOUS | Status: DC

## 2011-10-14 MED ORDER — ACETAMINOPHEN 80 MG RE SUPP: 20.0000 mg/kg | RECTAL | Status: DC | PRN

## 2011-10-14 MED ORDER — HYDROMORPHONE HCL 2 MG PO TABS: 2.0000 mg | ORAL_TABLET | ORAL | Status: AC | PRN

## 2011-10-14 SURGICAL SUPPLY — 37 items
ADH SKN CLS APL DERMABOND .7 (GAUZE/BANDAGES/DRESSINGS) ×1
BINDER BREAST XLRG (GAUZE/BANDAGES/DRESSINGS) ×1 IMPLANT
BLADE HEX COATED 2.75 (ELECTRODE) ×1 IMPLANT
BLADE SURG 15 STRL LF DISP TIS (BLADE) IMPLANT
BLADE SURG 15 STRL SS (BLADE) ×2
CANISTER SUCTION 1200CC (MISCELLANEOUS) ×1 IMPLANT
CHLORAPREP W/TINT 26ML (MISCELLANEOUS) ×1 IMPLANT
CLIP TI WIDE RED SMALL 6 (CLIP) ×1 IMPLANT
CLOTH BEACON ORANGE TIMEOUT ST (SAFETY) ×1 IMPLANT
COVER MAYO STAND STRL (DRAPES) ×1 IMPLANT
COVER TABLE BACK 60X90 (DRAPES) ×1 IMPLANT
DERMABOND ADVANCED (GAUZE/BANDAGES/DRESSINGS) ×1
DERMABOND ADVANCED .7 DNX12 (GAUZE/BANDAGES/DRESSINGS) IMPLANT
DEVICE DUBIN W/COMP PLATE 8390 (MISCELLANEOUS) ×1 IMPLANT
DRAPE LAPAROTOMY TRNSV 102X78 (DRAPE) ×1 IMPLANT
DRAPE UTILITY XL STRL (DRAPES) ×1 IMPLANT
ELECT REM PT RETURN 9FT ADLT (ELECTROSURGICAL) ×2
ELECTRODE REM PT RTRN 9FT ADLT (ELECTROSURGICAL) IMPLANT
GLOVE EUDERMIC 7 POWDERFREE (GLOVE) ×1 IMPLANT
GOWN PREVENTION PLUS XLARGE (GOWN DISPOSABLE) ×1 IMPLANT
GOWN PREVENTION PLUS XXLARGE (GOWN DISPOSABLE) ×1 IMPLANT
KIT MARKER MARGIN INK (KITS) ×1 IMPLANT
NDL HYPO 25X1 1.5 SAFETY (NEEDLE) IMPLANT
NEEDLE HYPO 25X1 1.5 SAFETY (NEEDLE) ×2 IMPLANT
NS IRRIG 1000ML POUR BTL (IV SOLUTION) ×1 IMPLANT
PACK BASIN DAY SURGERY FS (CUSTOM PROCEDURE TRAY) ×1 IMPLANT
PENCIL BUTTON HOLSTER BLD 10FT (ELECTRODE) ×1 IMPLANT
SLEEVE SCD COMPRESS KNEE MED (MISCELLANEOUS) ×1 IMPLANT
SPONGE GAUZE 4X4 12PLY (GAUZE/BANDAGES/DRESSINGS) ×1 IMPLANT
SPONGE LAP 4X18 X RAY DECT (DISPOSABLE) ×1 IMPLANT
STAPLER VISISTAT 35W (STAPLE) ×1 IMPLANT
SUT MNCRL AB 4-0 PS2 18 (SUTURE) ×1 IMPLANT
SUT VICRYL 3-0 CR8 SH (SUTURE) ×1 IMPLANT
SYR CONTROL 10ML LL (SYRINGE) ×1 IMPLANT
TOWEL OR NON WOVEN STRL DISP B (DISPOSABLE) ×1 IMPLANT
TUBE CONNECTING 20X1/4 (TUBING) ×1 IMPLANT
YANKAUER SUCT BULB TIP NO VENT (SUCTIONS) ×1 IMPLANT

## 2011-10-14 NOTE — Op Note (Signed)
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 Unfortunately, pathology showed carcinoma with a close medial margin. After lengthy discussion with the patient we elected to proceed to a reexcision of the medial margin.  Procedure: The patient was seen in the holding area and we reviewed the plans for the procedure risks and complications. All questions were answered. I reviewed the chart. I marked her right breast as the operative side.  The patient was taken to the operating room and after satisfactory general anesthesia had been obtained the right breast was prepped and draped. A time out was performed.  I extended the prior incision medially. I only opened about the medial half of the prior incision. About 1.5 cm deep I entered the seroma cavity and evacuated back. I then took approximately a 1.5 cm thick portion of tissue from the medial portion of the biopsy cavity to include a little bit of both the superior and inferior portions of the cavity. I went down as deep as the breast extended.At the end we were as far medial as the nipple.  I spent several minutes here gating and making sure everything was dry. I then injected approximately 30 cc of 0.25% plain Marcaine to help with postop analgesia. A year again began. I put clips in the mark the new medial margin. I closed the deeper layers as I could with some 3-0 Vicryl but ended up leaving something of the cavity to form again. The subcutaneous tissues were closed with 3-0 Vicryl as well. Skin was closed with 4-0 Monocryl subcuticular plus Dermabond. The specimen was labeled with ink shows an enlarged side and sent to  pathology.  The patient tolerated the procedure well. There was minimal blood loss. All counts were correct.

## 2011-10-14 NOTE — H&P (View-Only) (Signed)
          Progress Notes Arielys E Quezada (MR# [id number])         Progress Notes Info       Author  Note Status  Last Update User  Last Update Date/Time    Kahne Helfand J Nzinga Ferran, MD  Signed  Somer Trotter J Octavie Westerhold, MD  10/10/2011 12:36 PM          Progress Notes     NAME: [name] DOB: 01/05/1936  DATE: 10/10/2011 MRN: [id number]  CC: post op excisional breast Bx  HPI:  This patient comes in for post op follow-up.Sheunderwent NL excision of right breast calcifications on 09/24/2011. She feels that she is doing well.  PE:  General: The patient appears to be healthy, NAD  Incision healing nicely - no infection or hematoma  DATA REVIEWED:  Path shows IDC, receptor positve with 1 cm marign, but associated DCIS with 1mm medial margin  IMPRESSION:  The patient is doing well S/P NL lumpectom.  PLAN:  She will need re-excision for margin. I went over the path report with her and family. She was presented at the breast conference and no SLN indicated, possibly no radiation depending on final path          Progress Notes   Kloi E Heidecker (MR# [id number])       Progress Notes Info       Author Note Status Last Update User Last Update Date/Time    Manahil Vanzile J Nashia Remus, MD Signed Paisly Fingerhut J Kyian Obst, MD 09/06/2011 11:38 AM      Progress Notes     Chief Complaint   Patient presents with   .  Other       new pt- eval rt br calcifications        HPI [name] is a 75 y.o. female.  She was found to have some breast calcifications in the right breast. A recent followup mammogram showed some change in these calcifications. A core biopsy was attempted, but unsuccessful because the calcifications were too far posterior. Surgical excision was recommended.   The patient is having no breast symptoms whatsoever. Around 1980 she had a surgical biopsy in the right breast for some benign process. She does not recall any of the details. HPI    Past Medical History   Diagnosis  Date     .  Hypertension     .  Hyperlipidemia     .  Breast calcifications         right         Past Surgical History   Procedure  Date   .  Appendectomy  1954   .  Abdominal hysterectomy  1983   .  Rotator cuff repair  2010       left   .  Breast biopsy  1980       right breast         Family History   Problem  Relation  Age of Onset   .  Stroke  Mother     .  Heart disease  Father     .  Cancer  Sister         breast        Social History History   Substance Use Topics   .  Smoking status:  Never Smoker    .  Smokeless tobacco:  Not on file   .  Alcohol Use:  No           Allergies   Allergen  Reactions   .  Amoxicillin           Current Outpatient Prescriptions   Medication  Sig  Dispense  Refill   .  aspirin (BAYER ASPIRIN) 325 MG tablet  Take 325 mg by mouth daily.           .  bimatoprost (LUMIGAN) 0.01 % SOLN  1 drop at bedtime.           .  enalapril (VASOTEC) 10 MG tablet  Take 10 mg by mouth daily.           .  pioglitazone (ACTOS) 45 MG tablet  Take 45 mg by mouth daily.           .  rosuvastatin (CRESTOR) 40 MG tablet  Take 40 mg by mouth daily.           .  triamterene-hydrochlorothiazide (DYAZIDE) 37.5-25 MG per capsule  Take 1 capsule by mouth every morning.                Review of Systems Review of Systems She has filled out our 12 point review of systems and it is negative.She does have HBP, Hi Chol, and eye problems     Blood pressure 134/86, pulse 60, temperature 97.3 F (36.3 C), temperature source Temporal, resp. rate 16, height 5' 1.5" (1.562 m), weight 207 lb 6.4 oz (94.076 kg).   Physical Exam Physical Exam GENERAL:   The patient is alert, oriented, and generally healthy-appearing, NAD. Mood and affect are normal.   HEENT:   The head is normocephalic, the eyes nonicteric, the pupils were round regular and equal. EOMs are normal. Pharynx normal. Dentition good.   NECK:   The neck is supple and there are no masses or  thyromegaly.   LUNGS: Normal respirations and clear to auscultation.   HEART: Regular rhythm, with no murmurs rubs or gallops. Pulses are intact carotid dorsalis pedis and posterior tibial. No significant varicosities are noted.   BREASTS:  The breasts are symmetric in appearance. There is no palpable mass on either side. The skin nipple and areolar areas are normal. There is a well-healed surgical scar in the right breast upper outer quadrant. There is nothing that would suggest a mass.   LYMPHATICS: There is no axillary or supraclavicular adenopathy.   ABDOMEN: Soft, flat, and nontender. No masses or organomegaly is noted. No hernias are noted. Bowel sounds are normal.   EXTREMITIES:   Good range of motion, no edema.     Data Reviewed I have reviewed the mammogram films and reports. I've also looked at her other medical history information.   Assessment    Indeterminate right breast calcifications     Plan    NL Excisional biopsy. I have discussed indications risks and complications of the surgery with the patient and her daughter and son. All questions have been answered. They would like to go ahead and schedule surgery.            Delante Karapetyan J 09/06/2011, 11:33 AM             

## 2011-10-14 NOTE — Anesthesia Postprocedure Evaluation (Signed)
  Anesthesia Post-op Note  Patient: [name]  Procedure(s) Performed:  CYST EXCISION BREAST - Re-excision of right breast cancer  Patient Location: PACU  Anesthesia Type: General  Level of Consciousness: awake and alert   Airway and Oxygen Therapy: Patient Spontanous Breathing  Post-op Pain: mild  Post-op Assessment: Post-op Vital signs reviewed, Patient's Cardiovascular Status Stable, Respiratory Function Stable, Patent Airway, No signs of Nausea or vomiting, Adequate PO intake and Pain level controlled  Post-op Vital Signs: Reviewed and stable  Complications: No apparent anesthesia complications

## 2011-10-14 NOTE — Anesthesia Procedure Notes (Addendum)
Performed by: Meyer Russel   Procedure Name: LMA Insertion Date/Time: 10/14/2011 1:31 PM Performed by: Meyer Russel Pre-anesthesia Checklist: Patient identified, Timeout performed, Emergency Drugs available, Suction available and Patient being monitored Patient Re-evaluated:Patient Re-evaluated prior to inductionOxygen Delivery Method: Circle System Utilized Preoxygenation: Pre-oxygenation with 100% oxygen Intubation Type: IV induction Ventilation: Mask ventilation without difficulty LMA: LMA with gastric port inserted LMA Size: 4.0 Number of attempts: 1 Placement Confirmation: positive ETCO2 and breath sounds checked- equal and bilateral Tube secured with: Tape Dental Injury: Teeth and Oropharynx as per pre-operative assessment

## 2011-10-14 NOTE — Transfer of Care (Signed)
Immediate Anesthesia Transfer of Care Note  Patient: [name]  Procedure(s) Performed:  CYST EXCISION BREAST - Re-excision of right breast cancer  Patient Location: PACU  Anesthesia Type: General  Level of Consciousness: awake and alert   Airway & Oxygen Therapy: Patient Spontanous Breathing and Patient connected to face mask oxygen  Post-op Assessment: Report given to PACU RN, Post -op Vital signs reviewed and stable and Patient moving all extremities X 4  Post vital signs: Reviewed and stable  Complications: No apparent anesthesia complications

## 2011-10-14 NOTE — Anesthesia Preprocedure Evaluation (Addendum)
Anesthesia Evaluation  Patient identified by MRN, date of birth, ID band Patient awake    Reviewed: Allergy & Precautions, H&P , NPO status , Patient's Chart, lab work & pertinent test results, reviewed documented beta blocker date and time   Airway Mallampati: II TM Distance: >3 FB Neck ROM: full    Dental No notable dental hx.    Pulmonary neg pulmonary ROS, pneumonia ,    Pulmonary exam normal       Cardiovascular hypertension, On Medications and On Home Beta Blockers     Neuro/Psych Negative Neurological ROS  Negative Psych ROS   GI/Hepatic negative GI ROS, Neg liver ROS,   Endo/Other  Diabetes mellitus-, Well Controlled, Type obesity  Renal/GU negative Renal ROS  Genitourinary negative   Musculoskeletal   Abdominal   Peds  Hematology negative hematology ROS (+)   Anesthesia Other Findings   Reproductive/Obstetrics negative OB ROS                          Anesthesia Physical Anesthesia Plan  ASA: III  Anesthesia Plan: General   Post-op Pain Management:    Induction: Intravenous  Airway Management Planned: LMA  Additional Equipment:   Intra-op Plan:   Post-operative Plan: Extubation in OR  Informed Consent: I have reviewed the patients History and Physical, chart, labs and discussed the procedure including the risks, benefits and alternatives for the proposed anesthesia with the patient or authorized representative who has indicated his/her understanding and acceptance.   Dental Advisory Given  Plan Discussed with: CRNA and Surgeon  Anesthesia Plan Comments:       Anesthesia Quick Evaluation

## 2011-10-14 NOTE — Interval H&P Note (Signed)
There have been no changes since the prior H&P. The exam is unchanged. We have reviewed the plans for surgery and all questions answered. I have marked the right breast as the operative side

## 2011-10-17 ENCOUNTER — Telehealth (INDEPENDENT_AMBULATORY_CARE_PROVIDER_SITE_OTHER): Payer: Self-pay | Admitting: General Surgery

## 2011-10-17 NOTE — Telephone Encounter (Signed)
Message copied by Liliana Cline on Thu Oct 17, 2011 11:57 AM ------      Message from: Currie Paris      Created: Thu Oct 17, 2011 11:04 AM       Tell the patient that her margins are OK no more cancer. I will discuss in detail in the office.

## 2011-10-17 NOTE — Telephone Encounter (Signed)
Patient aware margins are negative. She still has not heard about appts with Oncologists in [place]. I told her I would check on her appts and call back.

## 2011-10-21 ENCOUNTER — Telehealth (INDEPENDENT_AMBULATORY_CARE_PROVIDER_SITE_OTHER): Payer: Self-pay | Admitting: Surgery

## 2011-10-21 NOTE — Telephone Encounter (Signed)
Pt has a po appt scheduled on 11/08/11, she will be going out of town on 11/09/11.  She would like to come in sooner than 11/08/11 if possible, please call.

## 2011-10-21 NOTE — Telephone Encounter (Signed)
Patient made aware no appts open prior to the 30th. That I would keep my eye open for cancellations and call her if an opening becomes available. I made her aware I contacted [facility] department and have her an appt with Med and Rad oncologists on Monday 10/28/11. To see Dr Mitzi Hansen at 8:15am and Dr Emeline Darling at 11:30am. Patient to call if she has any other questions.

## 2011-10-29 ENCOUNTER — Encounter (HOSPITAL_BASED_OUTPATIENT_CLINIC_OR_DEPARTMENT_OTHER): Payer: Self-pay | Admitting: Surgery

## 2011-11-08 ENCOUNTER — Encounter (INDEPENDENT_AMBULATORY_CARE_PROVIDER_SITE_OTHER): Payer: Self-pay | Admitting: Surgery

## 2011-11-08 ENCOUNTER — Ambulatory Visit (INDEPENDENT_AMBULATORY_CARE_PROVIDER_SITE_OTHER): Payer: Medicare Other | Admitting: Surgery

## 2011-11-08 VITALS — BP 148/86 | HR 78 | Temp 97.2°F | Resp 20 | Ht 61.0 in | Wt 201.0 lb

## 2011-11-08 DIAGNOSIS — Z09 Encounter for follow-up examination after completed treatment for conditions other than malignant neoplasm: Secondary | ICD-10-CM

## 2011-11-08 NOTE — Progress Notes (Signed)
[name]    [id number] 11/08/2011    01/05/1936   CC: Post op visit  HPI: The patient returns for post op follow-up. She underwent a re-excision of a Right lumpectomy on 10/14/2011. Over all she feels that she is doing well.She has a fair amount of tenderness at the lateral part of the lumpectomy incision    PE: The incision is healing nicely and there is no evidence of infection or hematoma.  Marland Kitchen  DATA REVIEWED: Pathology report showed no residual cancer  IMPRESSION: Patient doing well.   PLAN: Her next visit will be in two months when she has finished radiation.

## 2011-11-20 ENCOUNTER — Telehealth: Payer: Self-pay | Admitting: *Deleted

## 2011-11-20 ENCOUNTER — Encounter: Payer: Self-pay | Admitting: *Deleted

## 2011-11-20 NOTE — Telephone Encounter (Signed)
Called pt about scheduling a medical oncology appointment and she stated that she already is a patient of Dr. Ubaldo Glassing in [place].  Told her that Dr. Jamey Ripa as well as Dr. Mitzi Hansen is requesting for her to have a follow up appointment since she has had surgery and that someone from Dr. Saintclair Halsted office would be calling her within the next 42 hours to schedule that appointment and if she did not hear anything to please call them to get this appointment scheduled.  She understood and agreed.  I faxed a request to Dr. Saintclair Halsted office for a follow up appointment to be scheduled and called Talbert Forest in rad onc to make her aware.

## 2012-01-03 ENCOUNTER — Encounter (INDEPENDENT_AMBULATORY_CARE_PROVIDER_SITE_OTHER): Payer: Medicare Other | Admitting: Surgery

## 2012-01-03 ENCOUNTER — Encounter (INDEPENDENT_AMBULATORY_CARE_PROVIDER_SITE_OTHER): Payer: Self-pay | Admitting: Surgery

## 2012-01-03 ENCOUNTER — Ambulatory Visit (INDEPENDENT_AMBULATORY_CARE_PROVIDER_SITE_OTHER): Payer: Medicare Other | Admitting: Surgery

## 2012-01-03 VITALS — BP 130/70 | HR 68 | Temp 98.1°F | Resp 16 | Ht 61.5 in | Wt 197.0 lb

## 2012-01-03 DIAGNOSIS — C50419 Malignant neoplasm of upper-outer quadrant of unspecified female breast: Secondary | ICD-10-CM

## 2012-01-03 NOTE — Progress Notes (Signed)
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 She just completed her radiation therapy and came in for followup. She does note that she had significant burning especially after her very last treatment.  Past history family history and review of systems are noted in the EM R. and not repeated here.  Exam: Gen.: The patient is alert oriented and appears healthy. Breasts: The left breasts completely normal. The right breast shows acute radiation changes with edema and increased pigmentation. She has marked pigmentation changes and tenderness laterally near the lumpectomy site. The remainder of the breast is soft.  Data reviewed: I looked over the notes from the oncologist  Impression: Status post radiation for right breast cancer Plan: I will see her back in about six months for a followup exam.

## 2012-01-15 ENCOUNTER — Encounter (INDEPENDENT_AMBULATORY_CARE_PROVIDER_SITE_OTHER): Payer: Medicare Other | Admitting: Surgery

## 2012-03-04 ENCOUNTER — Other Ambulatory Visit: Payer: Self-pay

## 2012-03-04 ENCOUNTER — Encounter (HOSPITAL_COMMUNITY)
Admission: EM | Disposition: A | Discharge status: Skilled Nursing Facility | Payer: Self-pay | Source: Home / Self Care | Attending: Orthopedic Surgery

## 2012-03-04 ENCOUNTER — Encounter (HOSPITAL_COMMUNITY): Payer: Self-pay | Admitting: Anesthesiology

## 2012-03-04 ENCOUNTER — Emergency Department (HOSPITAL_COMMUNITY): Payer: Medicare Other

## 2012-03-04 ENCOUNTER — Inpatient Hospital Stay (HOSPITAL_COMMUNITY)
Admission: EM | Admit: 2012-03-04 | Discharge: 2012-03-07 | DRG: 494 | Disposition: A | Discharge status: Skilled Nursing Facility | Payer: Medicare Other | Attending: Orthopedic Surgery | Admitting: Orthopedic Surgery

## 2012-03-04 ENCOUNTER — Inpatient Hospital Stay (HOSPITAL_COMMUNITY): Payer: Medicare Other | Admitting: Anesthesiology

## 2012-03-04 ENCOUNTER — Inpatient Hospital Stay (HOSPITAL_COMMUNITY): Payer: Medicare Other

## 2012-03-04 ENCOUNTER — Encounter (HOSPITAL_COMMUNITY): Payer: Self-pay | Admitting: Emergency Medicine

## 2012-03-04 DIAGNOSIS — S82009A Unspecified fracture of unspecified patella, initial encounter for closed fracture: Secondary | ICD-10-CM

## 2012-03-04 DIAGNOSIS — W230XXA Caught, crushed, jammed, or pinched between moving objects, initial encounter: Secondary | ICD-10-CM | POA: Diagnosis present

## 2012-03-04 DIAGNOSIS — Z7982 Long term (current) use of aspirin: Secondary | ICD-10-CM

## 2012-03-04 DIAGNOSIS — Y998 Other external cause status: Secondary | ICD-10-CM

## 2012-03-04 HISTORY — PX: ORIF PATELLA: SHX5033

## 2012-03-04 LAB — CBC
HCT: 34.5 % — ABNORMAL LOW (ref 36.0–46.0)
Platelets: 245 10*3/uL (ref 150–400)
RBC: 3.67 MIL/uL — ABNORMAL LOW (ref 3.87–5.11)
RDW: 14.7 % (ref 11.5–15.5)
WBC: 6.6 10*3/uL (ref 4.0–10.5)

## 2012-03-04 LAB — BASIC METABOLIC PANEL
CO2: 28 mEq/L (ref 19–32)
Chloride: 106 mEq/L (ref 96–112)
GFR calc Af Amer: >90 mL/min (ref >90)
Potassium: 3.7 mEq/L (ref 3.5–5.1)

## 2012-03-04 LAB — PROTIME-INR: Prothrombin Time: 14.1 seconds (ref 11.6–15.2)

## 2012-03-04 LAB — GLUCOSE, CAPILLARY
Glucose-Capillary: 64 mg/dL — ABNORMAL LOW (ref 70–99)
Glucose-Capillary: 141 mg/dL — ABNORMAL HIGH (ref 70–99)

## 2012-03-04 SURGERY — OPEN REDUCTION INTERNAL FIXATION (ORIF) PATELLA
Anesthesia: General | Site: Knee | Laterality: Right | Wound class: Clean

## 2012-03-04 MED ORDER — FENTANYL CITRATE 0.05 MG/ML IJ SOLN
INTRAMUSCULAR | Status: AC
  Administered 2012-03-04: 25 ug via INTRAVENOUS
  Filled 2012-03-04: qty 5

## 2012-03-04 MED ORDER — ROCURONIUM BROMIDE 50 MG/5ML IV SOLN
INTRAVENOUS | Status: AC
  Filled 2012-03-04: qty 1

## 2012-03-04 MED ORDER — METHOCARBAMOL 500 MG PO TABS
500.0000 mg | ORAL_TABLET | Freq: Four times a day (QID) | ORAL | Status: DC
  Administered 2012-03-04 – 2012-03-07 (×11): 500 mg via ORAL
  Filled 2012-03-04 (×11): qty 1

## 2012-03-04 MED ORDER — ACETAMINOPHEN 325 MG PO TABS
650.0000 mg | ORAL_TABLET | Freq: Four times a day (QID) | ORAL | Status: DC | PRN
  Administered 2012-03-06 (×2): 650 mg via ORAL
  Filled 2012-03-04 (×2): qty 2

## 2012-03-04 MED ORDER — SUCCINYLCHOLINE CHLORIDE 20 MG/ML IJ SOLN
INTRAMUSCULAR | Status: DC | PRN
  Administered 2012-03-04: 100 mg via INTRAVENOUS

## 2012-03-04 MED ORDER — DEXTROSE 50 % IV SOLN
INTRAVENOUS | Status: AC
  Filled 2012-03-04: qty 50

## 2012-03-04 MED ORDER — ONDANSETRON HCL 4 MG/2ML IJ SOLN: 4.0000 mg | Freq: Once | INTRAMUSCULAR | Status: DC | PRN

## 2012-03-04 MED ORDER — LACTATED RINGERS IV SOLN: INTRAVENOUS | Status: DC

## 2012-03-04 MED ORDER — TRIAMTERENE-HCTZ 37.5-25 MG PO TABS
1.0000 | ORAL_TABLET | ORAL | Status: DC
  Administered 2012-03-05 – 2012-03-07 (×3): 1 via ORAL
  Filled 2012-03-04 (×6): qty 1

## 2012-03-04 MED ORDER — LIDOCAINE HCL (PF) 1 % IJ SOLN
INTRAMUSCULAR | Status: AC
  Filled 2012-03-04: qty 5

## 2012-03-04 MED ORDER — CELECOXIB 100 MG PO CAPS
400.0000 mg | ORAL_CAPSULE | Freq: Once | ORAL | Status: AC
  Administered 2012-03-04: 400 mg via ORAL

## 2012-03-04 MED ORDER — CELECOXIB 100 MG PO CAPS
200.0000 mg | ORAL_CAPSULE | Freq: Every day | ORAL | Status: DC
  Administered 2012-03-05 – 2012-03-07 (×3): 200 mg via ORAL
  Filled 2012-03-04 (×5): qty 2

## 2012-03-04 MED ORDER — BIMATOPROST 0.01 % OP SOLN
1.0000 [drp] | Freq: Every day | OPHTHALMIC | Status: DC
  Administered 2012-03-04 – 2012-03-07 (×3): 1 [drp] via OPHTHALMIC
  Filled 2012-03-04: qty 2.5

## 2012-03-04 MED ORDER — METHOCARBAMOL 100 MG/ML IJ SOLN
500.0000 mg | Freq: Four times a day (QID) | INTRAVENOUS | Status: DC
  Filled 2012-03-04 (×20): qty 5

## 2012-03-04 MED ORDER — ACETAMINOPHEN 10 MG/ML IV SOLN
INTRAVENOUS | Status: AC
  Administered 2012-03-04: 1000 mg via INTRAVENOUS
  Filled 2012-03-04: qty 100

## 2012-03-04 MED ORDER — ACETAMINOPHEN 10 MG/ML IV SOLN
1000.0000 mg | Freq: Once | INTRAVENOUS | Status: AC
  Administered 2012-03-04: 1000 mg via INTRAVENOUS

## 2012-03-04 MED ORDER — TRAMADOL HCL 50 MG PO TABS
50.0000 mg | ORAL_TABLET | Freq: Four times a day (QID) | ORAL | Status: DC
  Administered 2012-03-04 – 2012-03-07 (×12): 50 mg via ORAL
  Filled 2012-03-04 (×12): qty 1

## 2012-03-04 MED ORDER — CLINDAMYCIN PHOSPHATE 600 MG/50ML IV SOLN
INTRAVENOUS | Status: DC | PRN
  Administered 2012-03-04: 600 mg via INTRAVENOUS

## 2012-03-04 MED ORDER — ONDANSETRON HCL 4 MG/2ML IJ SOLN
4.0000 mg | Freq: Four times a day (QID) | INTRAMUSCULAR | Status: DC | PRN
  Administered 2012-03-05: 4 mg via INTRAVENOUS
  Filled 2012-03-04: qty 2

## 2012-03-04 MED ORDER — OXYCODONE HCL 5 MG PO TABS: 5.0000 mg | ORAL_TABLET | ORAL | Status: DC | PRN

## 2012-03-04 MED ORDER — MORPHINE SULFATE 4 MG/ML IJ SOLN
4.0000 mg | INTRAMUSCULAR | Status: DC | PRN
  Administered 2012-03-04: 4 mg via INTRAVENOUS
  Filled 2012-03-04 (×2): qty 1

## 2012-03-04 MED ORDER — DEXTROSE 5 % IV SOLN
500.0000 mg | Freq: Once | INTRAVENOUS | Status: AC
  Administered 2012-03-04: 500 mg via INTRAVENOUS
  Filled 2012-03-04: qty 5

## 2012-03-04 MED ORDER — CLINDAMYCIN PHOSPHATE 600 MG/50ML IV SOLN: 600.0000 mg | INTRAVENOUS | Status: DC

## 2012-03-04 MED ORDER — PHENOL 1.4 % MT LIQD: 1.0000 | OROMUCOSAL | Status: DC | PRN

## 2012-03-04 MED ORDER — FENTANYL CITRATE 0.05 MG/ML IJ SOLN
INTRAMUSCULAR | Status: DC | PRN
  Administered 2012-03-04: 100 ug via INTRAVENOUS
  Administered 2012-03-04: 50 ug via INTRAVENOUS
  Administered 2012-03-04: 100 ug via INTRAVENOUS

## 2012-03-04 MED ORDER — MENTHOL 3 MG MT LOZG: 1.0000 | LOZENGE | OROMUCOSAL | Status: DC | PRN

## 2012-03-04 MED ORDER — BIMATOPROST 0.01 % OP SOLN
1.0000 [drp] | Freq: Every day | OPHTHALMIC | Status: DC
  Administered 2012-03-04 – 2012-03-06 (×3): 1 [drp] via OPHTHALMIC
  Filled 2012-03-04: qty 2.5

## 2012-03-04 MED ORDER — ALUM & MAG HYDROXIDE-SIMETH 200-200-20 MG/5ML PO SUSP: 30.0000 mL | ORAL | Status: DC | PRN

## 2012-03-04 MED ORDER — FENTANYL CITRATE 0.05 MG/ML IJ SOLN
25.0000 ug | INTRAMUSCULAR | Status: DC | PRN
  Administered 2012-03-04: 50 ug via INTRAVENOUS

## 2012-03-04 MED ORDER — BUPIVACAINE-EPINEPHRINE PF 0.5-1:200000 % IJ SOLN
INTRAMUSCULAR | Status: DC | PRN
  Administered 2012-03-04: 60 mL

## 2012-03-04 MED ORDER — ONDANSETRON HCL 4 MG/2ML IJ SOLN
4.0000 mg | Freq: Once | INTRAMUSCULAR | Status: AC
  Administered 2012-03-04: 4 mg via INTRAVENOUS

## 2012-03-04 MED ORDER — LIDOCAINE HCL (CARDIAC) 10 MG/ML IV SOLN
INTRAVENOUS | Status: DC | PRN
  Administered 2012-03-04: 50 mg via INTRAVENOUS

## 2012-03-04 MED ORDER — PIOGLITAZONE HCL 30 MG PO TABS
45.0000 mg | ORAL_TABLET | Freq: Every day | ORAL | Status: DC
  Administered 2012-03-05 – 2012-03-07 (×3): 45 mg via ORAL
  Filled 2012-03-04: qty 2
  Filled 2012-03-04: qty 1
  Filled 2012-03-04 (×2): qty 2

## 2012-03-04 MED ORDER — MIDAZOLAM HCL 2 MG/2ML IJ SOLN
1.0000 mg | INTRAMUSCULAR | Status: DC | PRN
  Administered 2012-03-04: 2 mg via INTRAVENOUS

## 2012-03-04 MED ORDER — ACETAMINOPHEN 650 MG RE SUPP: 650.0000 mg | Freq: Four times a day (QID) | RECTAL | Status: DC | PRN

## 2012-03-04 MED ORDER — SODIUM CHLORIDE 0.9 % IR SOLN
Status: DC | PRN
  Administered 2012-03-04 (×2): 1000 mL

## 2012-03-04 MED ORDER — ATORVASTATIN CALCIUM 10 MG PO TABS
10.0000 mg | ORAL_TABLET | Freq: Every day | ORAL | Status: DC
  Administered 2012-03-04 – 2012-03-06 (×3): 10 mg via ORAL
  Filled 2012-03-04 (×3): qty 1

## 2012-03-04 MED ORDER — FENTANYL CITRATE 0.05 MG/ML IJ SOLN
INTRAMUSCULAR | Status: AC
  Administered 2012-03-04: 50 ug via INTRAVENOUS
  Filled 2012-03-04: qty 2

## 2012-03-04 MED ORDER — METOCLOPRAMIDE HCL 5 MG/ML IJ SOLN: 5.0000 mg | Freq: Three times a day (TID) | INTRAMUSCULAR | Status: DC | PRN

## 2012-03-04 MED ORDER — ASPIRIN 325 MG PO TABS: 325.0000 mg | ORAL_TABLET | Freq: Every day | ORAL | Status: DC

## 2012-03-04 MED ORDER — DOCUSATE SODIUM 100 MG PO CAPS
100.0000 mg | ORAL_CAPSULE | Freq: Two times a day (BID) | ORAL | Status: DC
  Administered 2012-03-04 – 2012-03-06 (×5): 100 mg via ORAL
  Filled 2012-03-04 (×6): qty 1

## 2012-03-04 MED ORDER — MIDAZOLAM HCL 2 MG/2ML IJ SOLN
INTRAMUSCULAR | Status: AC
  Filled 2012-03-04: qty 2

## 2012-03-04 MED ORDER — ENALAPRIL MALEATE 5 MG PO TABS
10.0000 mg | ORAL_TABLET | Freq: Every day | ORAL | Status: DC
  Administered 2012-03-05 – 2012-03-07 (×3): 10 mg via ORAL
  Filled 2012-03-04 (×3): qty 2

## 2012-03-04 MED ORDER — MORPHINE SULFATE 4 MG/ML IJ SOLN
4.0000 mg | INTRAMUSCULAR | Status: DC | PRN
  Administered 2012-03-05: 4 mg via INTRAVENOUS

## 2012-03-04 MED ORDER — MAGNESIUM CITRATE PO SOLN: 1.0000 | Freq: Once | ORAL | Status: AC | PRN

## 2012-03-04 MED ORDER — FENTANYL CITRATE 0.05 MG/ML IJ SOLN
INTRAMUSCULAR | Status: AC
  Filled 2012-03-04: qty 2

## 2012-03-04 MED ORDER — SORBITOL 70 % SOLN
30.0000 mL | Freq: Every day | Status: DC | PRN
  Filled 2012-03-04: qty 30

## 2012-03-04 MED ORDER — LACTATED RINGERS IV SOLN
INTRAVENOUS | Status: DC | PRN
  Administered 2012-03-04 (×2): via INTRAVENOUS

## 2012-03-04 MED ORDER — OXYCODONE HCL 5 MG PO TABS
ORAL_TABLET | ORAL | Status: AC
  Administered 2012-03-04: 5 mg via ORAL
  Filled 2012-03-04: qty 1

## 2012-03-04 MED ORDER — KETOROLAC TROMETHAMINE 30 MG/ML IJ SOLN
30.0000 mg | Freq: Once | INTRAMUSCULAR | Status: AC
  Administered 2012-03-04: 30 mg via INTRAVENOUS

## 2012-03-04 MED ORDER — ENOXAPARIN SODIUM 30 MG/0.3ML ~~LOC~~ SOLN
30.0000 mg | Freq: Two times a day (BID) | SUBCUTANEOUS | Status: DC
  Administered 2012-03-05 – 2012-03-07 (×4): 30 mg via SUBCUTANEOUS
  Filled 2012-03-04 (×4): qty 0.3

## 2012-03-04 MED ORDER — DEXTROSE 50 % IV SOLN
12.5000 g | Freq: Once | INTRAVENOUS | Status: AC
  Administered 2012-03-04: 12.5 g via INTRAVENOUS

## 2012-03-04 MED ORDER — SODIUM CHLORIDE 0.9 % IV SOLN
INTRAVENOUS | Status: DC
  Administered 2012-03-04 – 2012-03-06 (×4): via INTRAVENOUS

## 2012-03-04 MED ORDER — CLINDAMYCIN PHOSPHATE 600 MG/50ML IV SOLN
600.0000 mg | Freq: Four times a day (QID) | INTRAVENOUS | Status: AC
  Administered 2012-03-04 – 2012-03-05 (×3): 600 mg via INTRAVENOUS
  Filled 2012-03-04 (×3): qty 50

## 2012-03-04 MED ORDER — ONDANSETRON HCL 4 MG/2ML IJ SOLN
INTRAMUSCULAR | Status: AC
  Administered 2012-03-04: 4 mg via INTRAVENOUS
  Filled 2012-03-04: qty 2

## 2012-03-04 MED ORDER — PROPOFOL 10 MG/ML IV EMUL
INTRAVENOUS | Status: DC | PRN
  Administered 2012-03-04: 150 mg via INTRAVENOUS

## 2012-03-04 MED ORDER — ACETAMINOPHEN 10 MG/ML IV SOLN
1000.0000 mg | Freq: Four times a day (QID) | INTRAVENOUS | Status: AC
  Administered 2012-03-04 – 2012-03-05 (×3): 1000 mg via INTRAVENOUS
  Filled 2012-03-04 (×3): qty 100

## 2012-03-04 MED ORDER — POLYETHYLENE GLYCOL 3350 17 G PO PACK
17.0000 g | PACK | Freq: Every day | ORAL | Status: DC
  Administered 2012-03-04 – 2012-03-06 (×3): 17 g via ORAL
  Filled 2012-03-04 (×4): qty 1

## 2012-03-04 MED ORDER — SENNA 8.6 MG PO TABS
1.0000 | ORAL_TABLET | Freq: Two times a day (BID) | ORAL | Status: DC
  Administered 2012-03-04 – 2012-03-07 (×6): 8.6 mg via ORAL
  Filled 2012-03-04 (×6): qty 1

## 2012-03-04 MED ORDER — METOCLOPRAMIDE HCL 10 MG PO TABS: 5.0000 mg | ORAL_TABLET | Freq: Three times a day (TID) | ORAL | Status: DC | PRN

## 2012-03-04 MED ORDER — FENTANYL CITRATE 0.05 MG/ML IJ SOLN
25.0000 ug | INTRAMUSCULAR | Status: AC | PRN
  Administered 2012-03-04 (×2): 25 ug via INTRAVENOUS

## 2012-03-04 MED ORDER — CELECOXIB 100 MG PO CAPS
ORAL_CAPSULE | ORAL | Status: AC
  Administered 2012-03-04: 400 mg via ORAL
  Filled 2012-03-04: qty 4

## 2012-03-04 MED ORDER — SUCCINYLCHOLINE CHLORIDE 20 MG/ML IJ SOLN
INTRAMUSCULAR | Status: AC
  Filled 2012-03-04: qty 1

## 2012-03-04 MED ORDER — KETOROLAC TROMETHAMINE 30 MG/ML IJ SOLN
INTRAMUSCULAR | Status: AC
  Administered 2012-03-04: 30 mg via INTRAVENOUS
  Filled 2012-03-04: qty 1

## 2012-03-04 MED ORDER — TIMOLOL MALEATE 0.25 % OP SOLN
1.0000 [drp] | Freq: Every day | OPHTHALMIC | Status: DC
  Administered 2012-03-04 – 2012-03-07 (×4): 1 [drp] via OPHTHALMIC
  Filled 2012-03-04 (×2): qty 5

## 2012-03-04 MED ORDER — OXYCODONE HCL 5 MG PO TABS
5.0000 mg | ORAL_TABLET | ORAL | Status: DC
  Administered 2012-03-04: 5 mg via ORAL

## 2012-03-04 MED ORDER — ONDANSETRON HCL 4 MG/2ML IJ SOLN
INTRAMUSCULAR | Status: AC
  Filled 2012-03-04: qty 2

## 2012-03-04 MED ORDER — CLINDAMYCIN PHOSPHATE 600 MG/50ML IV SOLN
INTRAVENOUS | Status: AC
  Filled 2012-03-04: qty 50

## 2012-03-04 MED ORDER — BUPIVACAINE-EPINEPHRINE PF 0.5-1:200000 % IJ SOLN
INTRAMUSCULAR | Status: AC
  Filled 2012-03-04: qty 10

## 2012-03-04 MED ORDER — CHLORHEXIDINE GLUCONATE 4 % EX LIQD
60.0000 mL | Freq: Once | CUTANEOUS | Status: DC
  Filled 2012-03-04: qty 60

## 2012-03-04 MED ORDER — LACTATED RINGERS IV SOLN
INTRAVENOUS | Status: DC
  Administered 2012-03-04: 07:00:00 via INTRAVENOUS

## 2012-03-04 MED ORDER — PROPOFOL 10 MG/ML IV EMUL
INTRAVENOUS | Status: AC
  Filled 2012-03-04: qty 20

## 2012-03-04 SURGICAL SUPPLY — 62 items
BAG HAMPER (MISCELLANEOUS) ×2 IMPLANT
BANDAGE ELASTIC 6 VELCRO NS (GAUZE/BANDAGES/DRESSINGS) ×3 IMPLANT
BANDAGE ESMARK 6X9 LF (GAUZE/BANDAGES/DRESSINGS) ×1 IMPLANT
BLADE SURG SZ10 CARB STEEL (BLADE) ×4 IMPLANT
BNDG CMPR 9X6 STRL LF SNTH (GAUZE/BANDAGES/DRESSINGS) ×1
BNDG COHESIVE 4X5 TAN NS LF (GAUZE/BANDAGES/DRESSINGS) ×2 IMPLANT
BNDG ESMARK 6X9 LF (GAUZE/BANDAGES/DRESSINGS) ×2
CATH KIT ON Q 2.5IN SLV (PAIN MANAGEMENT) ×1 IMPLANT
CHLORAPREP W/TINT 26ML (MISCELLANEOUS) ×2 IMPLANT
CLOTH BEACON ORANGE TIMEOUT ST (SAFETY) ×2 IMPLANT
COOLER CRYO IC GRAV AND TUBE (ORTHOPEDIC SUPPLIES) ×1 IMPLANT
COVER SURGICAL LIGHT HANDLE (MISCELLANEOUS) ×4 IMPLANT
CUFF CRYO KNEE LG 20X31 COOLER (ORTHOPEDIC SUPPLIES) IMPLANT
CUFF CRYO KNEE18X23 MED (MISCELLANEOUS) ×1 IMPLANT
CUFF TOURNIQUET SINGLE 24IN (TOURNIQUET CUFF) IMPLANT
CUFF TOURNIQUET SINGLE 34IN LL (TOURNIQUET CUFF) IMPLANT
CUFF TOURNIQUET SINGLE 44IN (TOURNIQUET CUFF) ×1 IMPLANT
DECANTER SPIKE VIAL GLASS SM (MISCELLANEOUS) ×3 IMPLANT
DRAPE C-ARM FOLDED MOBILE STRL (DRAPES) ×2 IMPLANT
DRAPE INCISE IOBAN 66X45 STRL (DRAPES) ×1 IMPLANT
DRAPE PROXIMA HALF (DRAPES) ×3 IMPLANT
DRILL BIT 3.2MM 180 165MM (BIT) ×1 IMPLANT
DRSG MEPILEX BORDER 4X12 (GAUZE/BANDAGES/DRESSINGS) ×1 IMPLANT
ELECT REM PT RETURN 9FT ADLT (ELECTROSURGICAL) ×2
ELECTRODE REM PT RTRN 9FT ADLT (ELECTROSURGICAL) ×1 IMPLANT
GLOVE BIOGEL PI IND STRL 7.0 (GLOVE) IMPLANT
GLOVE BIOGEL PI INDICATOR 7.0 (GLOVE) ×3
GLOVE ECLIPSE 6.5 STRL STRAW (GLOVE) ×1 IMPLANT
GLOVE SKINSENSE NS SZ8.0 LF (GLOVE) ×1
GLOVE SKINSENSE STRL SZ8.0 LF (GLOVE) ×1 IMPLANT
GLOVE SS BIOGEL STRL SZ 6.5 (GLOVE) IMPLANT
GLOVE SS N UNI LF 8.5 STRL (GLOVE) ×2 IMPLANT
GLOVE SUPERSENSE BIOGEL SZ 6.5 (GLOVE) ×1
GOWN STRL REIN XL XLG (GOWN DISPOSABLE) ×7 IMPLANT
IMMOBILIZER KNEE 19 UNV (ORTHOPEDIC SUPPLIES) ×1 IMPLANT
INST SET MAJOR BONE (KITS) ×2 IMPLANT
INST SET MINOR BONE (KITS) ×1 IMPLANT
K-WIRE 229MX1.6 (WIRE) IMPLANT
K-Wire ×4 IMPLANT
KIT ROOM TURNOVER APOR (KITS) ×2 IMPLANT
MANIFOLD NEPTUNE II (INSTRUMENTS) ×2 IMPLANT
NDL HYPO 21X1.5 SAFETY (NEEDLE) ×1 IMPLANT
NEEDLE HYPO 21X1.5 SAFETY (NEEDLE) ×2 IMPLANT
NS IRRIG 1000ML POUR BTL (IV SOLUTION) ×3 IMPLANT
PACK BASIC LIMB (CUSTOM PROCEDURE TRAY) ×2 IMPLANT
PAD ARMBOARD 7.5X6 YLW CONV (MISCELLANEOUS) ×3 IMPLANT
PADDING CAST COTTON 6X4 STRL (CAST SUPPLIES) IMPLANT
SET BASIN LINEN APH (SET/KITS/TRAYS/PACK) ×2 IMPLANT
SPONGE GAUZE 4X4 12PLY (GAUZE/BANDAGES/DRESSINGS) IMPLANT
SPONGE LAP 18X18 X RAY DECT (DISPOSABLE) ×4 IMPLANT
STAPLER VISISTAT 35W (STAPLE) ×1 IMPLANT
SUT BRALON NAB BRD #1 30IN (SUTURE) ×4 IMPLANT
SUT CHROMIC 0 CTX 36 (SUTURE) IMPLANT
SUT ETHIBOND 5 LR DA (SUTURE) ×1 IMPLANT
SUT MON AB 0 CT1 (SUTURE) ×1 IMPLANT
SUT MON AB 2-0 CT1 36 (SUTURE) ×1 IMPLANT
SUT NUROLON CT 2 BLK #1 18IN (SUTURE) IMPLANT
SUT WIRE 18GA (Wire) ×2 IMPLANT
SYR BULB IRRIGATION 50ML (SYRINGE) ×4 IMPLANT
SYR CONTROL 10ML LL (SYRINGE) ×2 IMPLANT
TOWEL OR 17X26 4PK STRL BLUE (TOWEL DISPOSABLE) ×2 IMPLANT
YANKAUER SUCT BULB TIP 10FT TU (MISCELLANEOUS) ×1 IMPLANT

## 2012-03-04 NOTE — Transfer of Care (Signed)
Immediate Anesthesia Transfer of Care Note  Patient: [name]  Procedure(s) Performed: Procedure(s) (LRB): OPEN REDUCTION INTERNAL (ORIF) FIXATION PATELLA (Right)  Patient Location: PACU  Anesthesia Type: General  Level of Consciousness: awake, alert , oriented and patient cooperative  Airway & Oxygen Therapy: Patient Spontanous Breathing and Patient connected to face mask oxygen  Post-op Assessment: Report given to PACU RN and Post -op Vital signs reviewed and stable  Post vital signs: Reviewed and stable  Complications: No apparent anesthesia complications

## 2012-03-04 NOTE — Anesthesia Preprocedure Evaluation (Signed)
Anesthesia Evaluation  Patient identified by MRN, date of birth, ID band Patient awake    Reviewed: Allergy & Precautions, H&P , NPO status , Patient's Chart, lab work & pertinent test results, reviewed documented beta blocker date and time   History of Anesthesia Complications Negative for: history of anesthetic complications  Airway Mallampati: II TM Distance: >3 FB Neck ROM: full    Dental No notable dental hx. (+) Partial Upper and Partial Lower   Pulmonary neg pulmonary ROS,  breath sounds clear to auscultation  Pulmonary exam normal       Cardiovascular hypertension, On Medications, On Home Beta Blockers and Pt. on medications Rhythm:Regular Rate:Normal     Neuro/Psych negative neurological ROS  negative psych ROS   GI/Hepatic negative GI ROS, Neg liver ROS,   Endo/Other  Diabetes mellitus-, Well Controlled, Type 2, Oral Hypoglycemic AgentsMorbid obesity  Renal/GU negative Renal ROS  negative genitourinary   Musculoskeletal   Abdominal   Peds  Hematology negative hematology ROS (+)   Anesthesia Other Findings   Reproductive/Obstetrics negative OB ROS                           Anesthesia Physical Anesthesia Plan  ASA: III  Anesthesia Plan: General   Post-op Pain Management:    Induction: Intravenous  Airway Management Planned: Oral ETT  Additional Equipment:   Intra-op Plan:   Post-operative Plan: Extubation in OR  Informed Consent: I have reviewed the patients History and Physical, chart, labs and discussed the procedure including the risks, benefits and alternatives for the proposed anesthesia with the patient or authorized representative who has indicated his/her understanding and acceptance.     Plan Discussed with:   Anesthesia Plan Comments:         Anesthesia Quick Evaluation

## 2012-03-04 NOTE — Anesthesia Postprocedure Evaluation (Signed)
  Anesthesia Post-op Note  Patient: [name]  Procedure(s) Performed: Procedure(s) (LRB): OPEN REDUCTION INTERNAL (ORIF) FIXATION PATELLA (Right)  Patient Location: PACU  Anesthesia Type: General  Level of Consciousness: awake, alert , oriented and patient cooperative  Airway and Oxygen Therapy: Patient Spontanous Breathing and Patient connected to face mask oxygen  Post-op Pain: mild  Post-op Assessment: Post-op Vital signs reviewed, Patient's Cardiovascular Status Stable, Respiratory Function Stable, Patent Airway and No signs of Nausea or vomiting  Post-op Vital Signs: Reviewed and stable  Complications: No apparent anesthesia complications
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
## 2012-03-04 NOTE — Preoperative (Signed)
Beta Blockers   Reason not to administer Beta Blockers:Not Applicable 

## 2012-03-04 NOTE — Anesthesia Procedure Notes (Signed)
Procedure Name: Intubation Date/Time: 03/04/2012 1:17 PM Performed by: Carolyne Littles, Huel Centola L Pre-anesthesia Checklist: Patient identified, Timeout performed, Emergency Drugs available, Suction available and Patient being monitored Patient Re-evaluated:Patient Re-evaluated prior to inductionOxygen Delivery Method: Circle system utilized Intubation Type: IV induction and Cricoid Pressure applied Ventilation: Mask ventilation without difficulty Grade View: Grade I Airway Equipment and Method: Video-laryngoscopy Placement Confirmation: ETT inserted through vocal cords under direct vision,  positive ETCO2 and breath sounds checked- equal and bilateral Secured at: 20 cm Tube secured with: Tape Dental Injury: Teeth and Oropharynx as per pre-operative assessment
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
## 2012-03-04 NOTE — Op Note (Signed)
03/04/2012  3:18 PM  PATIENT:  [name]  76 y.o. female  PRE-OPERATIVE DIAGNOSIS:  Right Patella Fracture  POST-OPERATIVE DIAGNOSIS:  Right Patella Fracture  PROCEDURE:  Procedure(s) (LRB): OPEN REDUCTION INTERNAL (ORIF) FIXATION PATELLA (Right)  3 0.062 k wires + 1 circular 18 g wire + 1 # 5 ethibond suture   SURGEON:  Surgeon(s) and Role:    * Vickki Hearing, MD - Primary  Details of procedure  Right knee was marked as a surgical site and countersigned by the surgeon  After chart review the patient was taken to the operating room for general anesthesia she received 600 mg of clindamycin secondary to amoxicillin allergy  Right leg was prepped and draped in sterile technique. Timeout was completed.  Limb was exsanguinated with a six-inch Esmarch tourniquet was elevated to 300 mm of mercury  A midline incision was made and taken down to the extensor mechanism. A large hematoma was evacuated. The fracture was inspected cleaned debrided and noted to be in 3 parts with 1 transverse fracture component and then one vertical fracture component involving the inferior fragment  K wires were used to secure the fracture and then an 18-gauge wire was placed in a figure-of-eight fashion and a radiograph was obtained.  I was unhappy with this reduction in the fixation was removed. K wires were again used to secure the fracture in a circular 18-gauge wire was placed and a radiograph was taken. This gave an excellent reduction near anatomic. The knee was then flexed to 90 and the fracture was found to be stable. A medial arthrotomy mini was done to palpate the articular surface and it was found to be smooth.  The wires were bent and cut appropriately a #5 Ethibond suture was placed in cerclage fashion for his secondary tension band and protective fixation per the retinaculum was closed with #1 Bralon.  Prone irrigated and the closure was completed with 0 and 2-0 Monocryl. Staples used to  close the skin edges. The knee was injected with 60 cc of Marcaine with epinephrine and staples were used to reapproximate the skin.  PHYSICIAN ASSISTANT:   ASSISTANTS: [place] [employer]    ANESTHESIA:   general  EBL:  Total I/O In: 850 [I.V.:700; Other:150] Out: 100 [Urine:100]  BLOOD ADMINISTERED:none  DRAINS: none   LOCAL MEDICATIONS USED:  MARCAINE   With epi 60 cc SPECIMEN:  No Specimen  DISPOSITION OF SPECIMEN:  N/A  COUNTS:  YES  TOURNIQUET:   Total Tourniquet Time Documented: Thigh (Right) - 87 minutes  DICTATION: .Reubin Milan Dictation  PLAN OF CARE: Admit to inpatient   PATIENT DISPOSITION:  PACU - hemodynamically stable.   Delay start of Pharmacological VTE agent (>24hrs) due to surgical blood loss or risk of bleeding: yes  As I will be out of town starting tomorrow Dr. Hilda Lias will be covering for me the patient's weight bearing status is as tolerated. I have already talked to the family and the patient about rehabilitation they are agreeable. We can start Lovenox in the hospital and continue for 2 weeks.

## 2012-03-04 NOTE — Brief Op Note (Addendum)
03/04/2012  3:18 PM  PATIENT:  [name]  76 y.o. female  PRE-OPERATIVE DIAGNOSIS:  Right Patella Fracture  POST-OPERATIVE DIAGNOSIS:  Right Patella Fracture  PROCEDURE:  Procedure(s) (LRB): OPEN REDUCTION INTERNAL (ORIF) FIXATION PATELLA (Right)  3 0.062 k wires + 1 circular 18 g wire + 1 # 5 ethibond suture   SURGEON:  Surgeon(s) and Role:    * Vickki Hearing, MD - Primary  PHYSICIAN ASSISTANT:   ASSISTANTS: [place] [employer]    ANESTHESIA:   general  EBL:  Total I/O In: 850 [I.V.:700; Other:150] Out: 100 [Urine:100]  BLOOD ADMINISTERED:none  DRAINS: none   LOCAL MEDICATIONS USED:  MARCAINE   With epi 60 cc SPECIMEN:  No Specimen  DISPOSITION OF SPECIMEN:  N/A  COUNTS:  YES  TOURNIQUET:   Total Tourniquet Time Documented: Thigh (Right) - 87 minutes  DICTATION: .Reubin Milan Dictation  PLAN OF CARE: Admit to inpatient   PATIENT DISPOSITION:  PACU - hemodynamically stable.   Delay start of Pharmacological VTE agent (>24hrs) due to surgical blood loss or risk of bleeding: yes

## 2012-03-05 NOTE — Progress Notes (Signed)
MD stated he did not want dressing to right knee changed today, but he did want it changed in the am with covaderms.  covaderms placed in room per md request.  Will pass on in report.  Schonewitz, Candelaria Stagers 03/05/2012

## 2012-03-05 NOTE — Progress Notes (Signed)
   CARE MANAGEMENT NOTE 03/05/2012  Patient:  [name], [name]   Account Number:  [id number]  Date Initiated:  03/05/2012  Documentation initiated by:  Sharrie Rothman  Subjective/Objective Assessment:   Pt admitted from home with right patella fracture. Pt lives with son. Pt had repair of right patella on 03/04/12.     Action/Plan:   CM spoke with pt and she is requesting SNF for rehab. Pt is requesting placement in the [place] area. CSW made aware.   Anticipated DC Date:  03/12/2012   Anticipated DC Plan:  SKILLED NURSING FACILITY  In-house referral  Clinical Social Worker      DC Planning Services  CM consult      Choice offered to / List presented to:             Status of service:  Completed, signed off Medicare Important Message given?   (If response is "NO", the following Medicare IM given date fields will be blank) Date Medicare IM given:   Date Additional Medicare IM given:    Discharge Disposition:  SKILLED NURSING FACILITY  Per UR Regulation:    If discussed at Long Length of Stay Meetings, dates discussed:    Comments:  03/05/12 1420 Brandi Queen, RN BSN CM Pt admitted from home with right patella fracture. Pt is for SNF at discharge. CSW is aware and will make arrangements.

## 2012-03-05 NOTE — Evaluation (Signed)
Physical Therapy Evaluation Patient Details Name: [name] MRN: [id number] DOB: 01/05/1936 Today's Date: 03/05/2012  Problem List:  Patient Active Problem List  Diagnoses  . DIABETES  . SHOULDER PAIN  . ELBOW PAIN  . Unspecified disorders of bursae and tendons in shoulder region  . HIGH BLOOD PRESSURE  . 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 Hypertension   . Hyperlipidemia   . Breast calcifications     right  . Cancer of upper-outer quadrant of female breast 09/24/2011    surg   . Pneumonia     at age 38    . Glaucoma    Past Surgical History:  Past Surgical History  Procedure Date  . Appendectomy 1954  . Abdominal hysterectomy 1983  . Rotator cuff repair 2010    left  . Breast biopsy 1980    right breast  . Breast lumpectomy w/ needle localization 09/24/2011    Right Dr Jamey Ripa  . Breast cyst excision 10/14/2011    Procedure: CYST EXCISION BREAST;  Surgeon: Currie Paris, MD;  Location: [facility];  Service: General;  Laterality: Right;  Re-excision of right breast cancer    PT Assessment/Plan/Recommendation PT Assessment Clinical Impression Statement: Very pleasant and cooperative pt who had OTIF of R patella, now in immobilizer.  AA ROM of R knee is -5 to 50 deg.  We initiated gentle ROM ex which were tolerated well.  She required mod assist to transfer OOB, min assist to ambulate 22'.  She will need SNF at d/c.                                                          tella, now in i   mmobilize         r. PT Recommendation/Assessment: Patient will need skilled PT in the acute care venue PT Problem List: Decreased strength;Decreased range of motion;Decreased activity tolerance;Decreased mobility;Decreased knowledge of use of DME;Decreased safety awareness;Decreased knowledge of precautions;Obesity;Pain Barriers to Discharge: Decreased caregiver support PT Therapy  Diagnosis : Difficulty walking;Abnormality of gait;Acute pain;Generalized weakness PT Plan PT Frequency: Min 6X/week PT Treatment/Interventions: Gait training;Functional mobility training;Therapeutic exercise;Patient/family education PT Recommendation Follow Up Recommendations: Skilled nursing facility Equipment Recommended: Defer to next venue PT Goals  Acute Rehab PT Goals PT Goal Formulation: With patient Time For Goal Achievement: 2 weeks Pt will go Supine/Side to Sit: with min assist PT Goal: Supine/Side to Sit - Progress: Goal set today Pt will go Sit to Supine/Side: with min assist PT Goal: Sit to Supine/Side - Progress: Goal set today Pt will go Sit to Stand: with min assist PT Goal: Sit to Stand - Progress: Goal set today Pt will go Stand to Sit: with supervision PT Goal: Stand to Sit - Progress: Goal set today Pt will Ambulate: 16 - 50 feet;with supervision PT Goal: Ambulate - Progress: Goal set today  PT Evaluation Precautions/Restrictions  Precautions Precautions: Fall Required Braces or Orthoses: Yes Knee Immobilizer: On at all times;Other (comment) (except with PT) Restrictions Weight Bearing Restrictions: Yes RLE Weight Bearing: Weight bearing as tolerated Prior Functioning  Home Living Lives With: Alone Receives Help From: Family Type of Home: House Home Layout: One level Home Access: Level entry Home  Adaptive Equipment: None Prior Function Level of Independence: Independent with basic ADLs;Independent with homemaking with ambulation;Independent with gait;Independent with transfers Driving: Yes Vocation: Retired Producer, television/film/video: Awake/alert Overall Cognitive Status: Appears within functional limits for tasks assessed Orientation Level: Oriented X4 Sensation/Coordination Sensation Light Touch: Appears Intact Stereognosis: Not tested Hot/Cold: Not tested Proprioception: Appears Intact Coordination Gross Motor Movements are  Fluid and Coordinated: Yes Fine Motor Movements are Fluid and Coordinated: Yes Extremity Assessment RUE Assessment RUE Assessment: Within Functional Limits LUE Assessment LUE Assessment: Within Functional Limits RLE Assessment RLE Assessment: Exceptions to [facility] RLE PROM (degrees) RLE Overall PROM Comments: -5 to 50 deg Mobility (including Balance) Bed Mobility Bed Mobility: Yes Supine to Sit: 3: Mod assist;HOB elevated (Comment degrees) (HOB at 60 deg) Transfers Transfers: Yes Sit to Stand: 3: Mod assist Stand to Sit: 4: Min assist Ambulation/Gait Ambulation/Gait: Yes Ambulation/Gait Assistance: 4: Min assist Ambulation Distance (Feet): 22 Feet Assistive device: Rolling walker Gait Pattern: Decreased stance time - right;Antalgic Stairs: No Wheelchair Mobility Wheelchair Mobility: No  Posture/Postural Control Posture/Postural Control: No significant limitations Balance Balance Assessed: No Exercise  General Exercises - Lower Extremity Ankle Circles/Pumps: AROM;Both;10 reps;Supine Quad Sets: AROM;Both;10 reps;Supine Short Arc Quad: AAROM;Right;10 reps;Supine Heel Slides: AAROM;Right;10 reps;Supine End of Session PT - End of Session Equipment Utilized During Treatment: Gait belt Activity Tolerance: Patient tolerated treatment well Patient left: in chair;with call bell in reach;with family/visitor present Nurse Communication: Mobility status for transfers;Mobility status for ambulation General Behavior During Session: [facility] for tasks performed Cognition: [facility] for tasks performed  Konrad Penta 03/05/2012, 11:00 AM

## 2012-03-05 NOTE — Anesthesia Postprocedure Evaluation (Signed)
  Anesthesia Post-op Note  Patient: [name]  Procedure(s) Performed: Procedure(s) (LRB): OPEN REDUCTION INTERNAL (ORIF) FIXATION PATELLA (Right)  Patient Location: Room 337  Anesthesia Type: General  Level of Consciousness: awake, alert , oriented and patient cooperative  Airway and Oxygen Therapy: Patient Spontanous Breathing  Post-op Pain: mild  Post-op Assessment: Post-op Vital signs reviewed, Patient's Cardiovascular Status Stable, Respiratory Function Stable, Patent Airway and No signs of Nausea or vomiting  Post-op Vital Signs: Reviewed and stable  Complications: No apparent anesthesia complications

## 2012-03-05 NOTE — Progress Notes (Signed)
UR Chart Review Completed  

## 2012-03-05 NOTE — Addendum Note (Signed)
Addendum  created 03/05/12 1141 by Marolyn Hammock, CRNA   Modules edited:Notes Section

## 2012-03-05 NOTE — Progress Notes (Signed)
Physical Therapy Treatment Patient Details Name: [name] MRN: [id number] DOB: 01/05/1936 Today's Date: 03/05/2012  TIME: 1249-1315/ 1 TE 1 GT  PT Assessment/Plan  PT - Assessment/Plan Comments on Treatment Session: Patient was able to increase gait distance this afternoon by 10' for a total of 30' with RW Min A. Paitent did c/o of nausea prior to gait, but this seemed to pass after resting in recliner for 4-37mins. PT Frequency: Min 6X/week Follow Up Recommendations: Skilled nursing facility Equipment Recommended: Defer to next venue PT Goals  Acute Rehab PT Goals PT Goal Formulation: With patient Time For Goal Achievement: 2 weeks Pt will go Supine/Side to Sit: with min assist PT Goal: Supine/Side to Sit - Progress: Goal set today Pt will go Sit to Supine/Side: with min assist PT Goal: Sit to Supine/Side - Progress: Met Pt will go Sit to Stand: with min assist PT Goal: Sit to Stand - Progress: Met Pt will go Stand to Sit: with supervision PT Goal: Stand to Sit - Progress: Met Pt will Ambulate: 16 - 50 feet;with supervision PT Goal: Ambulate - Progress: Progressing toward goal  PT Treatment Precautions/Restrictions  Precautions Precautions: Fall Required Braces or Orthoses: Yes Knee Immobilizer: On at all times;Other (comment) (except with PT) Restrictions Weight Bearing Restrictions: Yes RLE Weight Bearing: Weight bearing as tolerated Mobility (including Balance) Bed Mobility Bed Mobility: Yes Supine to Sit: 3: Mod assist;HOB elevated (Comment degrees) (HOB at 60 deg) Sit to Supine: 4: Min assist Sit to Supine - Details (indicate cue type and reason): assistance needed with LEs Scooting to [facility]: 6: Modified independent (Device/Increase time) Transfers Transfers: Yes Sit to Stand: 4: Min assist Sit to Stand Details (indicate cue type and reason): assistance needed with steadiness Stand to Sit: 5: Supervision Stand to Sit Details: verbal cues to reach  back Ambulation/Gait Ambulation/Gait: Yes Ambulation/Gait Assistance: 4: Min assist Ambulation Distance (Feet): 32 Feet Assistive device: Rolling walker Gait Pattern: Decreased stance time - right;Antalgic Gait velocity: slow Stairs: No Wheelchair Mobility Wheelchair Mobility: No  Posture/Postural Control Posture/Postural Control: No significant limitations Balance Balance Assessed: No Exercise  General Exercises - Lower Extremity Ankle Circles/Pumps: 20 reps;Both Quad Sets: Both;10 reps Short Arc Quad: Seated;10 reps;Right Heel Slides: AAROM;Right;10 reps;Seated End of Session PT - End of Session Equipment Utilized During Treatment: Gait belt Activity Tolerance: Patient tolerated treatment well Patient left: in bed;with call bell in reach;with family/visitor present;with bed alarm set Nurse Communication: Mobility status for transfers;Mobility status for ambulation General Behavior During Session: [facility] for tasks performed Cognition: [facility] for tasks performed  Ayven Pheasant ATKINSO 03/05/2012, 1:23 PM

## 2012-03-06 DIAGNOSIS — S82009A Unspecified fracture of unspecified patella, initial encounter for closed fracture: Secondary | ICD-10-CM | POA: Diagnosis present

## 2012-03-06 MED ORDER — HYDROCODONE-ACETAMINOPHEN 5-325 MG PO TABS: 1.0000 | ORAL_TABLET | ORAL | Status: AC | PRN

## 2012-03-06 MED ORDER — TRAMADOL HCL 50 MG PO TABS: 50.0000 mg | ORAL_TABLET | Freq: Four times a day (QID) | ORAL | Status: AC

## 2012-03-06 MED ORDER — ENOXAPARIN SODIUM 30 MG/0.3ML ~~LOC~~ SOLN: 30.0000 mg | Freq: Two times a day (BID) | SUBCUTANEOUS | Status: DC

## 2012-03-06 NOTE — Progress Notes (Signed)
Physical Therapy Treatment Patient Details Name: [name] MRN: [id number] DOB: 01/05/1936 Today's Date: 03/06/2012  PT Assessment/Plan  PT - Assessment/Plan Comments on Treatment Session: MD note to do quad sets and SLR only was seen and greatly appreciated. Pt is doing extremely well with only minimal intermittent pain to R knee (with no pain med) and is transferring and ambulating well.  She remains very cooperative and follows all instructions well. PT Plan: Discharge plan remains appropriate;Frequency remains appropriate PT Goals  Acute Rehab PT Goals PT Goal: Supine/Side to Sit - Progress: Met PT Goal: Sit to Stand - Progress: Met PT Goal: Stand to Sit - Progress: Met PT Goal: Ambulate - Progress: Progressing toward goal  PT Treatment Precautions/Restrictions  Precautions Precautions: Fall Required Braces or Orthoses: Yes Knee Immobilizer: On at all times;Other (comment) (except with PT) Restrictions Weight Bearing Restrictions: Yes RLE Weight Bearing: Weight bearing as tolerated Other Position/Activity Restrictions: NO Flexion of R knee Mobility (including Balance) Bed Mobility Supine to Sit: 6: Modified independent (Device/Increase time) Supine to Sit Details (indicate cue type and reason): pt now able to do a SLR and therefore able to move RLE OOB independently Scooting to [facility]: 6: Modified independent (Device/Increase time) Transfers Sit to Stand: 6: Modified independent (Device/Increase time) Stand to Sit: 6: Modified independent (Device/Increase time) Squat Pivot Transfers: 5: Supervision Squat Pivot Transfer Details (indicate cue type and reason): instruction for correct hand placement Ambulation/Gait Ambulation/Gait: Yes Ambulation/Gait Assistance: 5: Supervision Ambulation Distance (Feet): 40 Feet Assistive device: Rolling walker Gait Pattern: Decreased stance time - right Stairs: No Wheelchair Mobility Wheelchair Mobility: No    Exercise  General  Exercises - Lower Extremity Ankle Circles/Pumps: AROM;Both;10 reps;Supine Quad Sets: AROM;Both;10 reps;Supine Short Arc Quad:  (D/C EXERCISE) Heel Slides: Other (comment) (D/C EXERCISE) Straight Leg Raises: AROM;Right;10 reps;Supine End of Session PT - End of Session Equipment Utilized During Treatment: Gait belt Activity Tolerance: Patient tolerated treatment well Patient left: in chair;with call bell in reach General Behavior During Session: [facility] for tasks performed Cognition: [facility] for tasks performed  Konrad Penta 03/06/2012, 9:16 AM

## 2012-03-06 NOTE — Progress Notes (Signed)
Subjective: 2 Days Post-Op Procedure(s) (LRB): OPEN REDUCTION INTERNAL (ORIF) FIXATION PATELLA (Right) Patient reports pain as mild.    Objective: Vital signs in last 24 hours: Temp:  [97.5 F (36.4 C)-98.8 F (37.1 C)] 98.8 F (37.1 C) (03/29 0441) Pulse Rate:  [63-95] 81  (03/29 0441) Resp:  [17] 17  (03/29 0441) BP: (94-145)/(55-76) 145/74 mmHg (03/29 0441) SpO2:  [91 %-100 %] 93 % (03/29 0441)  Intake/Output from previous day: 03/28 0701 - 03/29 0700 In: 2428.3 [P.O.:600; I.V.:1828.3] Out: 900 [Urine:900] Intake/Output this shift:     Basename 03/04/12 0451  HGB 11.5*    Basename 03/04/12 0451  WBC 6.6  RBC 3.67*  HCT 34.5*  PLT 245    Basename 03/04/12 0451  NA 139  K 3.7  CL 106  CO2 28  BUN 14  CREATININE 0.51  GLUCOSE 107*  CALCIUM 9.2    Basename 03/04/12 0451  LABPT --  INR 1.07    Incision: scant drainage  Assessment/Plan: 2 Days Post-Op Procedure(s) (LRB): OPEN REDUCTION INTERNAL (ORIF) FIXATION PATELLA (Right) Advance diet Up with therapy Discharge to SNF  [facility] 03/06/2012, 8:28 AM

## 2012-03-06 NOTE — Discharge Summary (Signed)
Physician Discharge Summary  Patient ID: [name] MRN: [id number] DOB/AGE: 74/28/1937 74 y.o.  Admit date: 03/04/2012 Discharge date: 03/06/2012  Admission Diagnoses: right patella fracture   Discharge Diagnoses: right patella fracture  Active Problems:  Patella fracture-right    Discharged Condition: good  Hospital Course: The patient was admitted with a right patella fracture and had surgery on March 27th , she did well   PRE-OPERATIVE DIAGNOSIS: Right Patella Fracture  POST-OPERATIVE DIAGNOSIS: Right Patella Fracture  PROCEDURE: Procedure(s) (LRB):  OPEN REDUCTION INTERNAL (ORIF) FIXATION PATELLA (Right)  3 0.062 k wires + 1 circular 18 g wire + 1 # 5 ethibond suture  SURGEON: Surgeon(s) and Role:  * Vickki Hearing, MD - Primary    She tolerated surgery well and PT was initiated and well tolerated. The incision was clean and she was referred for SNF  Consults: none    Significant Diagnostic Studies: PT  Treatments: surgery  Discharge Exam: Blood pressure 145/74, pulse 78, temperature 98.8 F (37.1 C), temperature source Oral, resp. rate 17, height 5\' 1"  (1.549 m), weight 94.7 kg (208 lb 12.4 oz), SpO2 95.00%. General appearance: alert, cooperative and no distress Extremities: Homans sign is negative, no sign of DVT and no edema, redness or tenderness in the calves or thighs Neurologic: Grossly normal Incision/Wound: scant sanguinous drainage   Disposition: 01-Home or Self Care  Discharge Orders    Future Orders Please Complete By Expires   Diet - low sodium heart healthy      Diet Carb Modified      Call MD / Call 911      Comments:   If you experience chest pain or shortness of breath, CALL 911 and be transported to the hospital emergency room.  If you develope a fever above 101 F, pus (white drainage) or increased drainage or redness at the wound, or calf pain, call your surgeon's office.   Constipation Prevention      Comments:   Drink plenty of  fluids.  Prune juice may be helpful.  You may use a stool softener, such as Colace (over the counter) 100 mg twice a day.  Use MiraLax (over the counter) for constipation as needed.   Increase activity slowly as tolerated      Weight Bearing as taught in Physical Therapy      Comments:   Use a walker or crutches as instructed.   Discharge instructions      Comments:   Cherlynn Polo out April 8th   Weight bearing as tolerated in brace   No ROM exercises   lovenox x 12 days     Medication List  As of 03/06/2012  8:38 AM   TAKE these medications         anastrozole 1 MG tablet   Commonly known as: ARIMIDEX   Take 1 mg by mouth daily.      BAYER ASPIRIN 325 MG tablet   Generic drug: aspirin   Take 325 mg by mouth daily. Pt instructed to stop aspirin today      bimatoprost 0.01 % Soln   Commonly known as: LUMIGAN   Place 1 drop into both eyes daily.      enalapril 10 MG tablet   Commonly known as: VASOTEC   Take 10 mg by mouth daily.      enoxaparin 30 MG/0.3ML injection   Commonly known as: LOVENOX   Inject 0.3 mLs (30 mg total) into the skin every 12 (twelve) hours.  HYDROcodone-acetaminophen 5-325 MG per tablet   Commonly known as: NORCO   Take 1 tablet by mouth every 4 (four) hours as needed for pain.      pioglitazone 45 MG tablet   Commonly known as: ACTOS   Take 45 mg by mouth daily.      rosuvastatin 40 MG tablet   Commonly known as: CRESTOR   Take 40 mg by mouth daily.      timolol 0.5 % ophthalmic solution   Commonly known as: TIMOPTIC   Place 1 drop into both eyes daily.      traMADol 50 MG tablet   Commonly known as: ULTRAM   Take 1 tablet (50 mg total) by mouth every 6 (six) hours.      triamterene-hydrochlorothiazide 37.5-25 MG per capsule   Commonly known as: DYAZIDE   Take 1 capsule by mouth every morning.           Follow-up Information    Follow up with Fuller Canada, MD on 03/18/2012. (dressing change and 2 views recumbent right knee)      Contact information:   [address], [address] [place] [address] [phone]          Signed: Fuller Canada 03/06/2012, 8:38 AM

## 2012-03-06 NOTE — Progress Notes (Signed)
Clinical Social Work Department BRIEF PSYCHOSOCIAL ASSESSMENT 03/06/2012  Patient:  [name], [name]     Account Number:  [id number]     Admit date:  03/04/2012  Clinical Social Worker:  Andres Shad  Date/Time:  03/06/2012 12:39 PM  Referred by:  Care Management  Date Referred:  03/06/2012 Referred for  SNF Placement   Other Referral:   Interview type:  Patient Other interview type:   also patient son in the room during interaction    PSYCHOSOCIAL DATA Living Status:  FAMILY Admitted from facility:   Level of care:   Primary support name:  son Primary support relationship to patient:  FAMILY Degree of support available:   very supportive.    CURRENT CONCERNS Current Concerns  Post-Acute Placement   Other Concerns:   Referral sent for SNF.    SOCIAL WORK ASSESSMENT / PLAN Met with patient and patient son.  reports they are agreeable to SNF and expressed interest in [facility].  Reports family has been in contact with [facility] and like the faiclity for patient to dc too.    Sent referral for placement at [facility] and they have made a  bed offer.  Agreeable to accept patient on Saturday 3/30 due to patient needing one more day for medicare coverage.   Assessment/plan status:  Psychosocial Support/Ongoing Assessment of Needs Other assessment/ plan:   Information/referral to community resources:   FL2 updated, [employer], and referral sent for SNF    PATIENT'S/FAMILY'S RESPONSE TO PLAN OF CARE: Patient and son very cooperative and agreeable to SNF. Agreeable for SNF: [facility].    Ashley Jacobs, MSW LCSW [phone]

## 2012-03-06 NOTE — Progress Notes (Signed)
Clinical Social Work Department CLINICAL SOCIAL WORK PLACEMENT NOTE 03/06/2012  Patient:  [name], [name]  Account Number:  [id number] Admit date:  03/04/2012  Clinical Social Worker:  Ashley Jacobs, LCSW  Date/time:  03/06/2012 12:44 PM  Clinical Social Work is seeking post-discharge placement for this patient at the following level of care:   SKILLED NURSING   (*CSW will update this form in Epic as items are completed)   03/06/2012  Patient/family provided with [facility] Department of Clinical Social Work's list of facilities offering this level of care within the geographic area requested by the patient (or if unable, by the patient's family).  03/06/2012  Patient/family informed of their freedom to choose among providers that offer the needed level of care, that participate in Medicare, Medicaid or managed care program needed by the patient, have an available bed and are willing to accept the patient.  03/06/2012  Patient/family informed of [facility]' ownership interest in [facility], as well as of the fact that they are under no obligation to receive care at this facility.  PASARR submitted to EDS on 03/05/2012 PASARR number received from EDS on   FL2 transmitted to all facilities in geographic area requested by pt/family on  03/06/2012 FL2 transmitted to all facilities within larger geographic area on   Patient informed that his/her managed care company has contracts with or will negotiate with  certain facilities, including the following:     Patient/family informed of bed offers received:  03/06/2012 Patient chooses bed at  Physician recommends and patient chooses bed at    Patient to be transferred to  on   Patient to be transferred to facility by   The following physician request were entered in Epic:   Additional Comments: Patient has accepted a bed [facility].  Will be able to transport and be placed on Saturday 3/30. Patient is [place] resident,  thus will not need a passar to be placed since she will be placed in [place], [place].  Ashley Jacobs, MSW LCSW [phone]

## 2012-03-07 MED ORDER — SODIUM CHLORIDE 0.9 % IJ SOLN
INTRAMUSCULAR | Status: AC
  Filled 2012-03-07: qty 3

## 2012-03-07 MED ORDER — ONDANSETRON HCL 4 MG/2ML IJ SOLN
INTRAMUSCULAR | Status: AC
  Administered 2012-03-07: 4 mg
  Filled 2012-03-07: qty 2

## 2012-03-07 NOTE — Progress Notes (Signed)
Clinical Social Worker facilitated discharge by speaking with patient and family in room and called facility, [facility]. CSW left two voicemail's regarding patient being discharged today. Patient will be transported via EMS. CSW will print out discharge packet, EMS form and facesheet and place in [facility]. CSW will sign off, as social work intervention is no longer needed.   Rozetta Nunnery MSW, [employer] (Weekend Coverage) [phone]

## 2012-03-07 NOTE — Progress Notes (Signed)
Patient was transferred to [facility] via EMS with packet. Family to follow. Patient left the floor via stretcher in stable condition. All questions were voiced and answered at that time.   Report given to Valeda Malm, LPN at [facility]. She verbalized understanding and all questions were voiced and answered at that time. Voiced to call with any further questions.

## 2012-03-09 ENCOUNTER — Encounter (HOSPITAL_COMMUNITY): Payer: Self-pay | Admitting: Orthopedic Surgery

## 2012-03-18 ENCOUNTER — Ambulatory Visit (INDEPENDENT_AMBULATORY_CARE_PROVIDER_SITE_OTHER): Payer: Medicare Other | Admitting: Orthopedic Surgery

## 2012-03-18 ENCOUNTER — Encounter: Payer: Self-pay | Admitting: Orthopedic Surgery

## 2012-03-18 ENCOUNTER — Telehealth: Payer: Self-pay | Admitting: Orthopedic Surgery

## 2012-03-18 VITALS — BP 128/78 | Ht 61.0 in | Wt 215.0 lb

## 2012-03-18 DIAGNOSIS — S82009A Unspecified fracture of unspecified patella, initial encounter for closed fracture: Secondary | ICD-10-CM

## 2012-03-18 NOTE — Telephone Encounter (Signed)
Relayed per Dr. Romeo Apple.

## 2012-03-18 NOTE — Progress Notes (Signed)
Patient ID: [name], female   DOB: 01/05/1936, 76 y.o.   MRN: [id number] Chief Complaint  Patient presents with  . Dressing Change    dressing change and xrays right patella, DOS 03/04/12    She is doing well   Long leg brace   WBAT   STAPLES R OUT   XRAYS TODAY   APPLY HINGED KNEE BRACE Locked in extension.  A separate x-ray report AP, lateral, of the RIGHT knee.  Multiple wires with a tension band construct are seen fixating a previously noted comminuted patella fracture. There is one staple, which was LEFT in and we have removed that.  Fracture fixation is intact. The articular surface is reduced.  Impression internal fixation, RIGHT knee with tension band construct.

## 2012-03-18 NOTE — Telephone Encounter (Signed)
Patient and son [name] (she is staying with son and daughter-in-law) are asking about frequency of dressing changes.  Son states patient is already set up to have home health with [facility], Ph [phone], Fax [phone].  Are there orders, not finding in chart?

## 2012-03-18 NOTE — Telephone Encounter (Signed)
We dont really need an order   The dressing is just to cover the incision  So daily dry dressing as needed

## 2012-03-18 NOTE — Patient Instructions (Signed)
WBAT  USE WALKER  KEEP BRACE ON EXCEPT TO BATHE

## 2012-04-16 ENCOUNTER — Ambulatory Visit (HOSPITAL_COMMUNITY)
Admission: RE | Admit: 2012-04-16 | Discharge: 2012-04-16 | Disposition: A | Discharge status: Home / Self Care | Payer: Medicare Other | Source: Ambulatory Visit | Attending: Orthopedic Surgery | Admitting: Orthopedic Surgery

## 2012-04-16 ENCOUNTER — Encounter: Payer: Self-pay | Admitting: Orthopedic Surgery

## 2012-04-16 ENCOUNTER — Other Ambulatory Visit: Payer: Self-pay | Admitting: Orthopedic Surgery

## 2012-04-16 ENCOUNTER — Ambulatory Visit (INDEPENDENT_AMBULATORY_CARE_PROVIDER_SITE_OTHER): Payer: Medicare Other | Admitting: Orthopedic Surgery

## 2012-04-16 VITALS — BP 104/60 | Ht 61.0 in | Wt 215.0 lb

## 2012-04-16 DIAGNOSIS — S82009A Unspecified fracture of unspecified patella, initial encounter for closed fracture: Secondary | ICD-10-CM

## 2012-04-16 DIAGNOSIS — Z4789 Encounter for other orthopedic aftercare: Secondary | ICD-10-CM | POA: Insufficient documentation

## 2012-04-16 NOTE — Patient Instructions (Addendum)
Brace  Dan Humphreys

## 2012-04-16 NOTE — Progress Notes (Signed)
Patient ID: [name], female   DOB: 01/05/1936, 76 y.o.   MRN: [id number] Chief Complaint  Patient presents with  . Follow-up    recheck right knee, DOS 03/04/12     The incision is clean the x-ray was done at [facility] the fracture has maintained its alignment  Her range of motion is 30. She's in a range of motion brace advanced 0-40 and therapy will be advanced as well followup 6 weeks repeat x-ray.

## 2012-04-28 ENCOUNTER — Telehealth: Payer: Self-pay | Admitting: Orthopedic Surgery

## 2012-04-28 NOTE — Telephone Encounter (Signed)
Call received from Melody at [facility], ph [phone], regarding patient's physical therapy; states patient has met all goals for in-home therapy, transferring to out-patient therapy.  States has the orders from Dr. Romeo Apple, and relays that patient will be seeing the same physical therapist for out-patient, Melford Aase, who is at [facility] in [place] (Ph [phone]). The order has been faxed to this therapy location; patient aware.

## 2012-05-11 ENCOUNTER — Encounter (INDEPENDENT_AMBULATORY_CARE_PROVIDER_SITE_OTHER): Payer: Self-pay | Admitting: Surgery

## 2012-05-28 ENCOUNTER — Encounter: Payer: Self-pay | Admitting: Orthopedic Surgery

## 2012-05-28 ENCOUNTER — Ambulatory Visit (INDEPENDENT_AMBULATORY_CARE_PROVIDER_SITE_OTHER): Payer: Medicare Other | Admitting: Orthopedic Surgery

## 2012-05-28 ENCOUNTER — Ambulatory Visit (INDEPENDENT_AMBULATORY_CARE_PROVIDER_SITE_OTHER): Payer: Medicare Other

## 2012-05-28 VITALS — BP 100/64 | Ht 61.0 in | Wt 190.0 lb

## 2012-05-28 DIAGNOSIS — S82009A Unspecified fracture of unspecified patella, initial encounter for closed fracture: Secondary | ICD-10-CM

## 2012-05-28 NOTE — Progress Notes (Signed)
Patient ID: [name], female   DOB: 01/05/1936, 76 y.o.   MRN: [id number] Chief Complaint  Patient presents with  . Follow-up    6 week recheck/xray patella, DOS 03/04/12    Follow-up      recheck right knee, DOS 03/04/12   X rays today:   Its 12 weeks post op   The x-ray shows complete fracture healing and no hardware complications. Her knee flexion is 95 her knee extension shows a 5 extensor lag. Her incision is well-healed. There is no swelling or tender

## 2012-05-28 NOTE — Patient Instructions (Addendum)
Continue physical therapy.  Her knee brace when up for the day does not sleeping. Knee brace.  Knee brace style wrap on hinge knee brace. Physical therapy will transition patient to a cane.

## 2012-06-17 ENCOUNTER — Encounter: Payer: Self-pay | Admitting: Orthopedic Surgery

## 2012-06-17 ENCOUNTER — Ambulatory Visit (INDEPENDENT_AMBULATORY_CARE_PROVIDER_SITE_OTHER): Payer: Medicare Other

## 2012-06-17 ENCOUNTER — Ambulatory Visit (INDEPENDENT_AMBULATORY_CARE_PROVIDER_SITE_OTHER): Payer: Medicare Other | Admitting: Orthopedic Surgery

## 2012-06-17 VITALS — BP 108/60 | Ht 61.0 in | Wt 190.0 lb

## 2012-06-17 DIAGNOSIS — M25561 Pain in right knee: Secondary | ICD-10-CM

## 2012-06-17 DIAGNOSIS — M25569 Pain in unspecified knee: Secondary | ICD-10-CM

## 2012-06-17 DIAGNOSIS — M7050 Other bursitis of knee, unspecified knee: Secondary | ICD-10-CM

## 2012-06-17 DIAGNOSIS — IMO0002 Reserved for concepts with insufficient information to code with codable children: Secondary | ICD-10-CM

## 2012-06-17 MED ORDER — SULFAMETHOXAZOLE-TRIMETHOPRIM 800-160 MG PO TABS: 1.0000 | ORAL_TABLET | Freq: Two times a day (BID) | ORAL | Status: AC

## 2012-06-17 NOTE — Patient Instructions (Signed)
You have received a steroid shot. 15% of patients experience increased pain at the injection site with in the next 24 hours. This is best treated with ice and tylenol extra strength 2 tabs every 8 hours. If you are still having pain please call the office.   1. Bursitis 2. Joint inflammation / tendonitis  3. Decrease activity  4. Decrease therapy to twice a week   Bursitis Bursitis is a swelling and soreness (inflammation) of a fluid-filled sac (bursa) that overlies and protects a joint. It can be caused by injury, overuse of the joint, arthritis or infection. The joints most likely to be affected are the elbows, shoulders, hips and knees. HOME CARE INSTRUCTIONS    Apply ice to the affected area for 15 to 20 minutes each hour while awake for 2 days. Put the ice in a plastic bag and place a towel between the bag of ice and your skin.   Rest the injured joint as much as possible, but continue to put the joint through a full range of motion, 4 times per day. (The shoulder joint especially becomes rapidly "frozen" if not used.) When the pain lessens, begin normal slow movements and usual activities.   Only take over-the-counter or prescription medicines for pain, discomfort or fever as directed by your caregiver.   Your caregiver may recommend draining the bursa and injecting medicine into the bursa. This may help the healing process.   Follow all instructions for follow-up with your caregiver. This includes any orthopedic referrals, physical therapy and rehabilitation. Any delay in obtaining necessary care could result in a delay or failure of the bursitis to heal and chronic pain.  SEEK IMMEDIATE MEDICAL CARE IF:    Your pain increases even during treatment.   You develop an oral temperature above 102 F (38.9 C) and have heat and inflammation over the involved bursa.  MAKE SURE YOU:    Understand these instructions.   Will watch your condition.   Will get help right away if you are not  doing well or get worse.  Document Released: 11/22/2000 Document Revised: 11/14/2011 Document Reviewed: 10/27/2009 [facility] Patient Information 2012 [place], [place].  Tendinitis Tendinitis is swelling and inflammation of the tendons. Tendons are band-like tissues that connect muscle to bone. Tendinitis commonly occurs in the:    Shoulders (rotator cuff).   Heels (Achilles tendon).   Elbows (triceps tendon).  CAUSES Tendinitis is usually caused by overusing the tendon, muscles, and joints involved. When the tissue surrounding a tendon (synovium) becomes inflamed, it is called tenosynovitis. Tendinitis commonly develops in people whose jobs require repetitive motions. SYMPTOMS  Pain.   Tenderness.   Mild swelling.  DIAGNOSIS Tendinitis is usually diagnosed by physical exam. Your caregiver may also order X-rays or other imaging tests. TREATMENT Your caregiver may recommend certain medicines or exercises for your treatment. HOME CARE INSTRUCTIONS    Use a sling or splint for as long as directed by your caregiver until the pain decreases.   Put ice on the injured area.   Put ice in a plastic bag.   Place a towel between your skin and the bag.   Leave the ice on for 15 to 20 minutes, 3 to 4 times a day.   Avoid using the limb while the tendon is painful. Perform gentle range of motion exercises only as directed by your caregiver. Stop exercises if pain or discomfort increase, unless directed otherwise by your caregiver.   Only take over-the-counter or prescription medicines for  pain, discomfort, or fever as directed by your caregiver.  SEEK MEDICAL CARE IF:    Your pain and swelling increase.   You develop new, unexplained symptoms, especially increased numbness in the hands.  MAKE SURE YOU:    Understand these instructions.   Will watch your condition.   Will get help right away if you are not doing well or get worse.  Document Released: 11/22/2000 Document Revised:  11/14/2011 Document Reviewed: 02/11/2011 [facility] Patient Information 2012 [place], [place].

## 2012-06-17 NOTE — Progress Notes (Signed)
Patient ID: [name], female   DOB: 01/05/1936, 76 y.o.   MRN: [id number] Chief Complaint  Patient presents with  . Follow-up    Recheck on right knee. DOS 03-04-12    Status post RIGHT patellar fracture, internal fixation about 12 weeks out maybe a little more than that. Started having pain and swelling since Monday. Last visit. We placed her in a hinged knee brace and start physical therapy. She was doing very well. No fevers.Complains of medial knee pain.herapy seems o be going well.Flexion is now 110.  Exam Physical Exam(6) GENERAL: normal development   CDV: pulses are normal   Skin: normal  Psychiatric: awake, alert and oriented  Neuro: normal sensation  MSK Ambulation as with the use of a cane and a brace on the RIGHT lower extremity. The knee is swollen. It does not appear to be in the joint, but around the parapatellar structures with medial joint line tenderness and tenderness over the pes anserine bursa. Flexion arc 110. He is stable extension power is normal. McMurray sign is negative. Knee is otherwise stable.  X-rays show no change in position of the hardware.  Assessment  Pes anserine bursitis Joint inflammation/tendinitis Rule out infection, cellulitis  Start antibiotic, Bactrim twice a day Decrease activity Decrease therapy Continue ice Followup one week one day

## 2012-06-25 ENCOUNTER — Ambulatory Visit (INDEPENDENT_AMBULATORY_CARE_PROVIDER_SITE_OTHER): Payer: Medicare Other | Admitting: Orthopedic Surgery

## 2012-06-25 ENCOUNTER — Encounter: Payer: Self-pay | Admitting: Orthopedic Surgery

## 2012-06-25 VITALS — Ht 61.0 in | Wt 190.0 lb

## 2012-06-25 DIAGNOSIS — S82009A Unspecified fracture of unspecified patella, initial encounter for closed fracture: Secondary | ICD-10-CM

## 2012-06-25 NOTE — Patient Instructions (Addendum)
Continue therapy 2 x a week

## 2012-06-25 NOTE — Progress Notes (Signed)
Patient ID: [name], female   DOB: 01/05/1936, 76 y.o.   MRN: [id number] Chief Complaint  Patient presents with  . Follow-up    Recheck right knee.   date of surgery 03/04/2012  Ht 5\' 1"  (1.549 m)  Wt 190 lb (86.183 kg)  BMI 35.90 kg/m2  Followup status post injection of the bursa and joint of the right knee status post internal fixation of patella fracture  We also adjusted her therapy  She has made significant improvement with decreased pain  Decrease swelling  She can do a straight leg raise now with minimal lag. Normal knee flexion. No swelling.  Continue twice a week therapy return August 22

## 2012-07-03 ENCOUNTER — Encounter (INDEPENDENT_AMBULATORY_CARE_PROVIDER_SITE_OTHER): Payer: Self-pay | Admitting: Surgery

## 2012-07-03 ENCOUNTER — Ambulatory Visit
Admission: RE | Admit: 2012-07-03 | Discharge: 2012-07-03 | Disposition: A | Discharge status: Home / Self Care | Payer: Medicare Other | Source: Ambulatory Visit | Attending: Surgery | Admitting: Surgery

## 2012-07-03 ENCOUNTER — Ambulatory Visit (INDEPENDENT_AMBULATORY_CARE_PROVIDER_SITE_OTHER): Payer: Medicare Other | Admitting: Surgery

## 2012-07-03 VITALS — BP 120/72 | HR 70 | Temp 96.9°F | Resp 14 | Ht 61.0 in | Wt 190.5 lb

## 2012-07-03 DIAGNOSIS — Z853 Personal history of malignant neoplasm of breast: Secondary | ICD-10-CM

## 2012-07-03 DIAGNOSIS — R599 Enlarged lymph nodes, unspecified: Secondary | ICD-10-CM

## 2012-07-03 DIAGNOSIS — R590 Localized enlarged lymph nodes: Secondary | ICD-10-CM

## 2012-07-03 NOTE — Progress Notes (Signed)
NAME: [name]       DOB: 01/05/1936           DATE: 07/03/2012       MRN: [id number]   [name] is a 76 y.o.Marland Kitchenfemale who presents for routine followup of her Stage I right breast cancer diagnosed in 2012 and treated with Lobectomy and radiation therapy, now on anti-estrogen therapy.. She has no problems or concerns on either side.  PFSH: She has had no significant changes since the last visit here.  ROS: There have been no significant changes since the last visit here  EXAM:  VS: BP 120/72  Pulse 70  Temp 96.9 F (36.1 C) (Temporal)  Resp 14  Ht 5\' 1"  (1.549 m)  Wt 190 lb 8 oz (86.41 kg)  BMI 35.99 kg/m2   General: The patient is alert, oriented, generally healthy appearing, NAD. Mood and affect are normal.  Breasts:  The right breast is smaller than the left. There are radiation changes noted with some peau d'orange. There is some ridging of the inframammary fold medially. The lumpectomy site is lateral and seems soft. There is nothing to suggest a recurrence. The left breast is completely normal.  Lymphatics: She has no axillary or supraclavicular adenopathy on The rightside.However, she does seem to have about a 2.5 or 3 cm soft left axillary lymph node. It is not tender. There is no left supraclavicular adenopathy.  Extremities: Full ROM of the surgical side with no lymphedema noted.There are no lesions on the left arm to account for left axillary adenopathy  Data Reviewed: No new data  Impression: Doing well, with no evidence of recurrent cancer or new cancer Enlarged left axillary node, probably benign but uncertain etiology  Plan: Will continue to follow up on an annual basis here.We will send her to the breast Center today and obtain a left mammogram and left axillary ultrasound to evaluate the enlarged lymph node.

## 2012-07-03 NOTE — Patient Instructions (Signed)
We are going to arrange a left-sided mammogram and ultrasound of the operative area because of the somewhat enlarged lymph node I noticed on your exam today. If this is all okay then you need a one-year followup with [place] for your breast cancer.

## 2012-07-14 ENCOUNTER — Encounter: Payer: Self-pay | Admitting: Orthopedic Surgery

## 2012-07-14 ENCOUNTER — Ambulatory Visit (INDEPENDENT_AMBULATORY_CARE_PROVIDER_SITE_OTHER): Payer: Medicare Other

## 2012-07-14 ENCOUNTER — Ambulatory Visit (INDEPENDENT_AMBULATORY_CARE_PROVIDER_SITE_OTHER): Payer: Medicare Other | Admitting: Orthopedic Surgery

## 2012-07-14 VITALS — BP 110/60 | Ht 61.0 in | Wt 190.0 lb

## 2012-07-14 DIAGNOSIS — S82009A Unspecified fracture of unspecified patella, initial encounter for closed fracture: Secondary | ICD-10-CM

## 2012-07-14 DIAGNOSIS — S82001A Unspecified fracture of right patella, initial encounter for closed fracture: Secondary | ICD-10-CM

## 2012-07-14 MED ORDER — HYDROCODONE-ACETAMINOPHEN 5-325 MG PO TABS: 1.0000 | ORAL_TABLET | Freq: Four times a day (QID) | ORAL | Status: AC | PRN

## 2012-07-14 MED ORDER — IBUPROFEN 800 MG PO TABS: 800.0000 mg | ORAL_TABLET | Freq: Three times a day (TID) | ORAL | Status: AC | PRN

## 2012-07-14 NOTE — Progress Notes (Signed)
Subjective:     Patient ID: [name], female   DOB: 01/05/1936, 76 y.o.   MRN: [id number] Chief Complaint  Patient presents with  . Knee Pain    increased right knee pain, DOS 03/04/12    Knee Pain    Right patella fracture open treatment internal fixation with tension band technique  Recently an intra-articular cortisone injection as well as bursal injection  Improved, then returned to physical therapy and started having pain again. Pain is circumferentially around the knee with minimal to no symptoms actually over the fracture site  Review of Systems     Objective:   Physical Exam Left medial joint line pain    Assessment:     Radiographic repeated compared to previous films show no change in position of the hardware.  Differential diagnosis fibrous union, nonunion, anterior knee pain, inflammatory response to over aggressive therapy    Plan:     Recommend start physical therapy Apply Max Fran [employer]es 3 times a day Take ibuprofen 3 times a day Take Norco for pain as needed every 4-6 hours when necessary Brace hinged knee brace wrap on style   Return in 2 weeks if no improvement reassess for possible reoperation with diagnostic arthroscopy versus complete immobilization

## 2012-07-14 NOTE — Patient Instructions (Addendum)
Apply biofreeze ( or Max Freeze)  3 times a day   Start new medications as directed   Wear brace   Stop therapy

## 2012-07-30 ENCOUNTER — Encounter: Payer: Self-pay | Admitting: Orthopedic Surgery

## 2012-07-30 ENCOUNTER — Ambulatory Visit (INDEPENDENT_AMBULATORY_CARE_PROVIDER_SITE_OTHER): Payer: Medicare Other | Admitting: Orthopedic Surgery

## 2012-07-30 VITALS — BP 118/60 | Ht 61.0 in | Wt 190.0 lb

## 2012-07-30 DIAGNOSIS — S82009A Unspecified fracture of unspecified patella, initial encounter for closed fracture: Secondary | ICD-10-CM

## 2012-07-30 NOTE — Progress Notes (Signed)
Patient ID: [name], female   DOB: 01/05/1936, 76 y.o.   MRN: [id number] Chief Complaint  Patient presents with  . Follow-up    2 week recheck Right patella, DOS 03/04/12    BP 118/60  Ht 5\' 1"  (1.549 m)  Wt 190 lb (86.183 kg)  BMI 35.90 kg/m2  Status post patella internal fixation for fracture  Had some postoperative pain which developed while she was performing physical therapy she was treated with injection did well came back with a peripatellar complaints  She was told to rest wear her brace use Vicodin and ibuprofen for pain  She is improved now with only mild discomfort around the knee  Clinical exam shows no swelling full extension normal knee flexion.  Impression inflammation/synovitis right knee status post internal fixation for patella fracture improved  Follow up as needed

## 2012-07-30 NOTE — Patient Instructions (Signed)
activities as tolerated 

## 2012-08-18 ENCOUNTER — Encounter: Payer: Medicare Other | Admitting: Internal Medicine

## 2012-08-18 DIAGNOSIS — C50919 Malignant neoplasm of unspecified site of unspecified female breast: Secondary | ICD-10-CM

## 2012-08-18 DIAGNOSIS — Z9010 Acquired absence of unspecified breast and nipple: Secondary | ICD-10-CM

## 2012-08-24 ENCOUNTER — Telehealth: Payer: Self-pay | Admitting: Orthopedic Surgery

## 2012-08-24 NOTE — Telephone Encounter (Signed)
[facility]art called this morning about his mother, [name], said she continues to have swelling and pain in her right knee.  Asking  For an appointment for this,  or do you have any other suggestions.  She is scheduled for an appointment this Wednesday for shoulder pain. Hobart's # [phone]

## 2012-08-24 NOTE — Telephone Encounter (Signed)
Do all knee and shoulder at same visit

## 2012-08-24 NOTE — Telephone Encounter (Signed)
Do All at same time

## 2012-08-25 NOTE — Telephone Encounter (Signed)
Left a message with [facility]

## 2012-08-26 ENCOUNTER — Encounter: Payer: Self-pay | Admitting: Orthopedic Surgery

## 2012-08-26 ENCOUNTER — Ambulatory Visit (INDEPENDENT_AMBULATORY_CARE_PROVIDER_SITE_OTHER): Payer: Medicare Other

## 2012-08-26 ENCOUNTER — Ambulatory Visit (INDEPENDENT_AMBULATORY_CARE_PROVIDER_SITE_OTHER): Payer: Medicare Other | Admitting: Orthopedic Surgery

## 2012-08-26 VITALS — BP 104/70 | Ht 61.0 in | Wt 190.0 lb

## 2012-08-26 DIAGNOSIS — M25519 Pain in unspecified shoulder: Secondary | ICD-10-CM

## 2012-08-26 DIAGNOSIS — S43429A Sprain of unspecified rotator cuff capsule, initial encounter: Secondary | ICD-10-CM

## 2012-08-26 DIAGNOSIS — M25511 Pain in right shoulder: Secondary | ICD-10-CM

## 2012-08-26 DIAGNOSIS — M19019 Primary osteoarthritis, unspecified shoulder: Secondary | ICD-10-CM

## 2012-08-26 DIAGNOSIS — M12819 Other specific arthropathies, not elsewhere classified, unspecified shoulder: Secondary | ICD-10-CM

## 2012-08-26 DIAGNOSIS — M75101 Unspecified rotator cuff tear or rupture of right shoulder, not specified as traumatic: Secondary | ICD-10-CM | POA: Insufficient documentation

## 2012-08-26 NOTE — Patient Instructions (Addendum)
You have received a steroid shot. 15% of patients experience increased pain at the injection site with in the next 24 hours. This is best treated with ice and tylenol extra strength 2 tabs every 8 hours. If you are still having pain please call the office.   Impingement Syndrome, Rotator Cuff, Bursitis with Rehab Impingement syndrome is a condition that involves inflammation of the tendons of the rotator cuff and the subacromial bursa, that causes pain in the shoulder. The rotator cuff consists of four tendons and muscles that control much of the shoulder and upper arm function. The subacromial bursa is a fluid filled sac that helps reduce friction between the rotator cuff and one of the bones of the shoulder (acromion). Impingement syndrome is usually an overuse injury that causes swelling of the bursa (bursitis), swelling of the tendon (tendonitis), and/or a tear of the tendon (strain). Strains are classified into three categories. Grade 1 strains cause pain, but the tendon is not lengthened. Grade 2 strains include a lengthened ligament, due to the ligament being stretched or partially ruptured. With grade 2 strains there is still function, although the function may be decreased. Grade 3 strains include a complete tear of the tendon or muscle, and function is usually impaired. SYMPTOMS    Pain around the shoulder, often at the outer portion of the upper arm.   Pain that gets worse with shoulder function, especially when reaching overhead or lifting.   Sometimes, aching when not using the arm.   Pain that wakes you up at night.   Sometimes, tenderness, swelling, warmth, or redness over the affected area.   Loss of strength.   Limited motion of the shoulder, especially reaching behind the back (to the back pocket or to unhook bra) or across your body.   Crackling sound (crepitation) when moving the arm.   Biceps tendon pain and inflammation (in the front of the shoulder). Worse when bending  the elbow or lifting.  CAUSES   Impingement syndrome is often an overuse injury, in which chronic (repetitive) motions cause the tendons or bursa to become inflamed. A strain occurs when a force is paced on the tendon or muscle that is greater than it can withstand. Common mechanisms of injury include: Stress from sudden increase in duration, frequency, or intensity of training.  Direct hit (trauma) to the shoulder.   Aging, erosion of the tendon with normal use.   Bony bump on shoulder (acromial spur).  RISK INCREASES WITH:  Contact sports (football, wrestling, boxing).   Throwing sports (baseball, tennis, volleyball).   Weightlifting and bodybuilding.   Heavy labor.   Previous injury to the rotator cuff, including impingement.   Poor shoulder strength and flexibility.   Failure to warm up properly before activity.   Inadequate protective equipment.   Old age.   Bony bump on shoulder (acromial spur).  PREVENTION    Warm up and stretch properly before activity.   Allow for adequate recovery between workouts.   Maintain physical fitness:   Strength, flexibility, and endurance.   Cardiovascular fitness.   Learn and use proper exercise technique.  PROGNOSIS   If treated properly, impingement syndrome usually goes away within 6 weeks. Sometimes surgery is required.   RELATED COMPLICATIONS    Longer healing time if not properly treated, or if not given enough time to heal.   Recurring symptoms, that result in a chronic condition.   Shoulder stiffness, frozen shoulder, or loss of motion.   Rotator cuff tendon tear.  Recurring symptoms, especially if activity is resumed too soon, with overuse, with a direct blow, or when using poor technique.  TREATMENT   Treatment first involves the use of ice and medicine, to reduce pain and inflammation. The use of strengthening and stretching exercises may help reduce pain with activity. These exercises may be performed at home  or with a therapist. If non-surgical treatment is unsuccessful after more than 6 months, surgery may be advised. After surgery and rehabilitation, activity is usually possible in 3 months.   MEDICATION  If pain medicine is needed, nonsteroidal anti-inflammatory medicines (aspirin and ibuprofen), or other minor pain relievers (acetaminophen), are often advised.   Do not take pain medicine for 7 days before surgery.   Prescription pain relievers may be given, if your caregiver thinks they are needed. Use only as directed and only as much as you need.   Corticosteroid injections may be given by your caregiver. These injections should be reserved for the most serious cases, because they may only be given a certain number of times.  HEAT AND COLD  Cold treatment (icing) should be applied for 10 to 15 minutes every 2 to 3 hours for inflammation and pain, and immediately after activity that aggravates your symptoms. Use ice packs or an ice massage.   Heat treatment may be used before performing stretching and strengthening activities prescribed by your caregiver, physical therapist, or athletic trainer. Use a heat pack or a warm water soak.  SEEK MEDICAL CARE IF:    Symptoms get worse or do not improve in 4 to 6 weeks, despite treatment.   New, unexplained symptoms develop. (Drugs used in treatment may produce side effects.)  Rotator Cuff Injury The rotator cuff is the collective set of muscles and tendons that make up the stabilizing unit of your shoulder. This unit holds in the ball of the humerus (upper arm bone) in the socket of the scapula (shoulder blade). Injuries to this stabilizing unit most commonly come from sports or activities that cause the arm to be moved repeatedly over the head. Examples of this include throwing, weight lifting, swimming, racquet sports, or an injury such as falling on your arm. Chronic (longstanding) irritation of this unit can cause inflammation (soreness), bursitis,  and eventual damage to the tendons to the point of rupture (tear). An acute (sudden) injury of the rotator cuff can result in a partial or complete tear. You may need surgery with complete tears. Small or partial rotator cuff tears may be treated conservatively with temporary immobilization, exercises and rest. Physical therapy may be needed. HOME CARE INSTRUCTIONS    Apply ice to the injury for 15 to 20 minutes 3 to 4 times per day for the first 2 days. Put the ice in a plastic bag and place a towel between the bag of ice and your skin.   If you have a shoulder immobilizer (sling and straps), do not remove it for as long as directed by your caregiver or until you see a caregiver for a follow-up examination. If you need to remove it, move your arm as little as possible.   You may want to sleep on several pillows or in a recliner at night to lessen swelling and pain.   Only take over-the-counter or prescription medicines for pain, discomfort, or fever as directed by your caregiver.   Do simple hand squeezing exercises with a soft rubber ball to decrease hand swelling.  SEEK MEDICAL CARE IF:    Pain in your  shoulder increases or new pain or numbness develops in your arm, hand, or fingers.   Your hand or fingers are colder than your other hand.  SEEK IMMEDIATE MEDICAL CARE IF:    Your arm, hand, or fingers are numb or tingling.   Your arm, hand, or fingers are increasingly swollen and painful, or turn white or blue.  Document Released: 11/22/2000 Document Revised: 11/14/2011 Document Reviewed: 11/15/2008 [facility] Patient Information 2012 [place], [place].

## 2012-08-26 NOTE — Progress Notes (Signed)
Patient ID: [name], female   DOB: 01/05/1936, 76 y.o.   MRN: [id number] Chief Complaint  Patient presents with  . Shoulder Pain    Right shoulder pain.    BP 104/70  Ht 5\' 1"  (1.549 m)  Wt 190 lb (86.183 kg)  BMI 35.90 kg/m2  Status post left rotator cuff repair presents with right shoulder pain and popping. The patient was moving a chair felt a loud pop in the right shoulder had increased pain and now has weakness in the right upper extremity. She does have night pain. She describes dull aching 310 intermittent pain associated with certain positions of the shoulder was placed in.  She also her right patella fracture with internal fixation proximal and 3 months ago. She still having some discomfort in that. She does not prefer to take the Norco for pain although it does relieve her knee pain    Past Medical History  Diagnosis Date  . Hypertension   . Hyperlipidemia   . Breast calcifications     right  . Cancer of upper-outer quadrant of female breast 09/24/2011    surg   . Pneumonia     at age 44    . Glaucoma     Past Surgical History  Procedure Date  . Appendectomy 1954  . Abdominal hysterectomy 1983  . Rotator cuff repair 2010    left  . Breast biopsy 1980    right breast  . Breast lumpectomy w/ needle localization 09/24/2011    Right Dr Jamey Ripa  . Breast cyst excision 10/14/2011    Procedure: CYST EXCISION BREAST;  Surgeon: Currie Paris, MD;  Location: [facility];  Service: General;  Laterality: Right;  Re-excision of right breast cancer  . Orif patella 03/04/2012    Procedure: OPEN REDUCTION INTERNAL (ORIF) FIXATION PATELLA;  Surgeon: Vickki Hearing, MD;  Location: [facility];  Service: Orthopedics;  Laterality: Right;    Review of systems related no instability no heat the joint no redness she does complain of some stiffness in the shoulder she has no neurologic symptoms such as numbness or tingling  Current Outpatient Prescriptions on File  Prior to Visit  Medication Sig Dispense Refill  . anastrozole (ARIMIDEX) 1 MG tablet Take 1 mg by mouth daily.      Marland Kitchen aspirin (BAYER ASPIRIN) 325 MG tablet Take 325 mg by mouth daily. Pt instructed to stop aspirin today      . bimatoprost (LUMIGAN) 0.01 % SOLN Place 1 drop into both eyes daily.      . calcium carbonate 200 MG capsule Take 333 mg by mouth 2 (two) times daily with a meal.      . Cholecalciferol (VITAMIN D-3 PO) Take 1,000 Units by mouth daily.      . Cyanocobalamin (B-12 PO) Take 500 mg by mouth daily.      . enalapril (VASOTEC) 10 MG tablet Take 10 mg by mouth daily.        . folic acid (FOLVITE) 800 MCG tablet Take 400 mcg by mouth daily.      . Ginkgo Biloba (GINKOBA PO) Take 120 mg by mouth daily.      . Omega-3 Fatty Acids (FISH OIL) 1000 MG CAPS Take by mouth daily.      . pioglitazone (ACTOS) 45 MG tablet Take 45 mg by mouth daily.        . rosuvastatin (CRESTOR) 40 MG tablet Take 40 mg by mouth daily.        Marland Kitchen  timolol (TIMOPTIC) 0.5 % ophthalmic solution Place 1 drop into both eyes daily.      Marland Kitchen triamterene-hydrochlorothiazide (DYAZIDE) 37.5-25 MG per capsule Take 1 capsule by mouth every morning.        . vitamin C (ASCORBIC ACID) 500 MG tablet Take 500 mg by mouth daily.      . vitamin E 400 UNIT capsule Take 400 Units by mouth daily.      Marland Kitchen enoxaparin (LOVENOX) 30 MG/0.3ML injection Inject 0.3 mLs (30 mg total) into the skin every 12 (twelve) hours.  14 Syringe  0   BP 104/70  Ht 5\' 1"  (1.549 m)  Wt 190 lb (86.183 kg)  BMI 35.90 kg/m2 Examination Physical Exam  Nursing note and vitals reviewed. Constitutional: She is oriented to person, place, and time. She appears well-developed and well-nourished. No distress.  HENT:  Head: Normocephalic.  Neck: Normal range of motion. No JVD present. No tracheal deviation present.  Cardiovascular: Intact distal pulses.   Pulmonary/Chest: No stridor.  Lymphadenopathy:    She has no cervical adenopathy.  Neurological: She  is alert and oriented to person, place, and time. She has normal reflexes. She displays normal reflexes. She exhibits normal muscle tone. Coordination normal.  Skin: Skin is warm and dry. No rash noted. There is erythema. No pallor.  Psychiatric: She has a normal mood and affect. Her behavior is normal. Judgment and thought content normal.  Right Shoulder Exam   Tenderness  The patient is experiencing tenderness in the acromioclavicular joint and acromion.  Range of Motion  Active Abduction: 90  Passive Abduction: 130  Right shoulder forward flexion: 150.  External Rotation: 50   Muscle Strength  Abduction: 5/5  Internal Rotation: 5/5  External Rotation: 5/5  Supraspinatus: 4/5  Subscapularis: 5/5  Biceps: 5/5   Tests  Apprehension: negative Cross Arm: negative Drop Arm: negative Hawkin's test: positive Impingement: positive  Other  Erythema: absent Scars: absent Sensation: normal Pulse: present   Left Shoulder Exam  Left shoulder exam is normal.  Tenderness  The patient is experiencing no tenderness.     Range of Motion  The patient has normal left shoulder ROM.  Muscle Strength  The patient has normal left shoulder strength.  Tests  Apprehension: negative Impingement: negative  Other  Erythema: absent Scars: present Sensation: normal Pulse: present      x-ray shows proximal humeral migration with humeral head articulation with the acromion and inferior spurring of the joint of the right shoulder  Impression rotator cuff arthropathy  Rotator cuff tear  Patient is not interested in surgical treatment at this time recommend injection   Shoulder Injection Procedure Note   Pre-operative Diagnosis: right  RC Syndrome  Post-operative Diagnosis: same  Indications: pain   Anesthesia: ethyl chloride   Procedure Details   Verbal consent was obtained for the procedure. The shoulder was prepped withalcohol and the skin was anesthetized. A 20  gauge needle was advanced into the subacromial space through posterior approach without difficulty  The space was then injected with 3 ml 1% lidocaine and 1 ml of depomedrol. The injection site was cleansed with isopropyl alcohol and a dressing was applied.  Complications:  None; patient tolerated the procedure well.

## 2012-10-01 ENCOUNTER — Other Ambulatory Visit: Payer: Self-pay | Admitting: Internal Medicine

## 2012-10-01 DIAGNOSIS — Z853 Personal history of malignant neoplasm of breast: Secondary | ICD-10-CM

## 2012-10-08 ENCOUNTER — Other Ambulatory Visit: Payer: Self-pay | Admitting: Internal Medicine

## 2012-10-08 ENCOUNTER — Ambulatory Visit
Admission: RE | Admit: 2012-10-08 | Discharge: 2012-10-08 | Disposition: A | Discharge status: Home / Self Care | Payer: Medicare Other | Source: Ambulatory Visit | Attending: Internal Medicine | Admitting: Internal Medicine

## 2012-10-08 DIAGNOSIS — Z853 Personal history of malignant neoplasm of breast: Secondary | ICD-10-CM

## 2012-11-09 ENCOUNTER — Ambulatory Visit
Admission: RE | Admit: 2012-11-09 | Discharge: 2012-11-09 | Disposition: A | Discharge status: Home / Self Care | Payer: Medicare Other | Source: Ambulatory Visit | Attending: Internal Medicine | Admitting: Internal Medicine

## 2012-11-09 ENCOUNTER — Other Ambulatory Visit: Payer: Self-pay | Admitting: Internal Medicine

## 2012-11-09 ENCOUNTER — Inpatient Hospital Stay: Admission: RE | Admit: 2012-11-09 | Payer: Medicare Other | Source: Ambulatory Visit

## 2012-11-09 DIAGNOSIS — Z853 Personal history of malignant neoplasm of breast: Secondary | ICD-10-CM

## 2013-04-20 ENCOUNTER — Encounter: Payer: Self-pay | Admitting: Orthopedic Surgery

## 2013-04-20 ENCOUNTER — Ambulatory Visit (INDEPENDENT_AMBULATORY_CARE_PROVIDER_SITE_OTHER): Payer: Medicare Other | Admitting: Orthopedic Surgery

## 2013-04-20 VITALS — BP 138/62 | Ht 61.0 in | Wt 190.0 lb

## 2013-04-20 DIAGNOSIS — M25561 Pain in right knee: Secondary | ICD-10-CM

## 2013-04-20 DIAGNOSIS — Z969 Presence of functional implant, unspecified: Secondary | ICD-10-CM

## 2013-04-20 DIAGNOSIS — M25569 Pain in unspecified knee: Secondary | ICD-10-CM | POA: Insufficient documentation

## 2013-04-20 DIAGNOSIS — IMO0002 Reserved for concepts with insufficient information to code with codable children: Secondary | ICD-10-CM

## 2013-04-20 DIAGNOSIS — Z9889 Other specified postprocedural states: Secondary | ICD-10-CM

## 2013-04-20 DIAGNOSIS — M7050 Other bursitis of knee, unspecified knee: Secondary | ICD-10-CM

## 2013-04-20 NOTE — Progress Notes (Signed)
Patient ID: [name], female   DOB: 01/05/1936, 77 y.o.   MRN: [id number] Chief Complaint  Patient presents with  . Knee Pain    Right knee pain    S/p OTIF right patella 02/2102 tension band  C/o superior knee pain and medial knee pain   ROS: no fever redness c/o medial swelling  Physical Exam(6) GENERAL: normal development   CDV: pulses are normal   Skin: normal  Psychiatric: awake, alert and oriented  Neuro: normal sensation  MSK Gait:  Limping a little, using cane  1 no joint effusion 2 medial pes tenderness, quad insertion tenderness with crepiatnce around superior hardware  3 extension power normal  4 flexion 125 degrees   Imaging: No imaging today but previous images show hardware from tension band technique the patella fracture which is healed Assessment: Has bursitis of the pes tendons and she has tenderness over the hardware and a bursitis forming over that near the quadriceps insertion    Plan: Injection for bursitis  Recommend hardware removal  KRight knee bursitisnee  Injection Procedure Note  Pre-operative Diagnosis: Right knee bursitis  Post-operative Diagnosis: same  Indications: pain  Anesthesia: ethyl chloride   Procedure Details   Verbal consent was obtained for the procedure. Time out was completed.The burs  was prepped with alcohol, followed by  Ethyl chloride spray and A 25 gauge needle was inserted into the pes bursa via lateral approach; 4ml 1% lidocaine and 1 ml of depomedrol  was then injected into the joint . The needle was removed and the area cleansed and dressed.  Complications:  None; patient tolerated the procedure well.

## 2013-04-20 NOTE — Patient Instructions (Addendum)
Talk to family about removal of hardware from Knee  Outpatient same day surgery   Today injection for bursitis  Bursitis Bursitis is when the fluid-filled sac (bursa) that covers and protects a joint gets puffy and irritated. The elbow, shoulder, hip, and knee joints are most often affected. HOME CARE  Put ice on the area.  Put ice in a plastic bag.  Place a towel between your skin and the bag.  Leave the ice on for 15 to 20 minutes, 3 to 4 times a day.  Put the joint through a full range of motion 4 times a day. Rest the injured joint at other times. When you have less pain, begin slow movements and usual activities.  Only take medicine as told by your doctor.  Follow up with your doctor. Any delay in care could stop the bursitis from healing. This could cause long-term pain. GET HELP RIGHT AWAY IF:   You have more pain with treatment.  You have a temperature by mouth above 102 F (38.9 C), not controlled by medicine.  You have heat and irritation over the fluid-filled sac. MAKE SURE YOU:   Understand these instructions.  Will watch your condition.  Will get help right away if you are not doing well or get worse. Document Released: 05/15/2010 Document Revised: 02/17/2012 Document Reviewed: 05/15/2010 [facility] Patient Information 2013 [place], [place].

## 2013-04-27 ENCOUNTER — Telehealth: Payer: Self-pay | Admitting: Orthopedic Surgery

## 2013-04-27 NOTE — Telephone Encounter (Signed)
[name] says she can have the knee surgery any time after 05/01/13.  Does she need to come back for an appointment?  [phone]

## 2013-04-28 ENCOUNTER — Other Ambulatory Visit: Payer: Self-pay | Admitting: *Deleted

## 2013-04-28 NOTE — Telephone Encounter (Signed)
Call review surgery details   Start precert process

## 2013-04-28 NOTE — Telephone Encounter (Signed)
Routed to nurse to call patient °

## 2013-04-29 ENCOUNTER — Other Ambulatory Visit: Payer: Self-pay | Admitting: *Deleted

## 2013-04-29 NOTE — Telephone Encounter (Signed)
Has. Mrs. [name] been scheduled for surgery if not let me know

## 2013-04-29 NOTE — Telephone Encounter (Signed)
Scheduled for surgery 05/14/13 at 0730. Patient aware of all dates and times.

## 2013-05-05 ENCOUNTER — Encounter (HOSPITAL_COMMUNITY): Payer: Self-pay | Admitting: Pharmacy Technician

## 2013-05-06 ENCOUNTER — Telehealth: Payer: Self-pay | Admitting: Orthopedic Surgery

## 2013-05-06 ENCOUNTER — Ambulatory Visit (INDEPENDENT_AMBULATORY_CARE_PROVIDER_SITE_OTHER): Payer: Medicare Other | Admitting: Orthopedic Surgery

## 2013-05-06 ENCOUNTER — Encounter: Payer: Self-pay | Admitting: Orthopedic Surgery

## 2013-05-06 VITALS — BP 114/68 | Ht 61.0 in | Wt 198.0 lb

## 2013-05-06 DIAGNOSIS — R609 Edema, unspecified: Secondary | ICD-10-CM

## 2013-05-06 MED ORDER — CIPROFLOXACIN HCL 500 MG PO TABS: 500.0000 mg | ORAL_TABLET | Freq: Two times a day (BID) | ORAL | Status: DC

## 2013-05-06 NOTE — Telephone Encounter (Signed)
Appointment scheduled for 05/06/13, patient aware

## 2013-05-06 NOTE — Patient Instructions (Addendum)
Diagnosis edema with phlebiltis   Wear ted hose both legs   Start cipro 500 mg twice daily   Call me with dose of fluid pill

## 2013-05-06 NOTE — Telephone Encounter (Signed)
Patient called back as requested, per office visit today, 05/06/13, with medication information:  Triamterine HCTZ tablet 37.5-25, 1 tablet by mouth daily.  Her phone #'s are: [phone] [facility]) [phone] [facility])

## 2013-05-06 NOTE — Patient Instructions (Addendum)
    [name]  05/06/2013   Your procedure is scheduled on:  05/14/2013  Report to [facility] at  615  AM.  Call this number if you have problems the morning of surgery: [phone]   Remember:   Do not eat food or drink liquids after midnight.   Take these medicines the morning of surgery with A SIP OF WATER:  Armidex,vasotec, dyazide   Do not wear jewelry, make-up or nail polish.  Do not wear lotions, powders, or perfumes.   Do not shave 48 hours prior to surgery. Men may shave face and neck.  Do not bring valuables to the hospital.  Contacts, dentures or bridgework may not be worn into surgery.  Leave suitcase in the car. After surgery it may be brought to your room.  For patients admitted to the hospital, checkout time is 11:00 AM the day of discharge.   Patients discharged the day of surgery will not be allowed to drive  home.  Name and phone number of your driver: family  Special Instructions: Shower using CHG 2 nights before surgery and the night before surgery.  If you shower the day of surgery use CHG.  Use special wash - you have one bottle of CHG for all showers.  You should use approximately 1/3 of the bottle for each shower.   Please read over the following fact sheets that you were given: Pain Booklet, Coughing and Deep Breathing, MRSA Information, Surgical Site Infection Prevention, Anesthesia Post-op Instructions and Care and Recovery After Surgery PATIENT INSTRUCTIONS POST-ANESTHESIA  IMMEDIATELY FOLLOWING SURGERY:  Do not drive or operate machinery for the first twenty four hours after surgery.  Do not make any important decisions for twenty four hours after surgery or while taking narcotic pain medications or sedatives.  If you develop intractable nausea and vomiting or a severe headache please notify your doctor immediately.  FOLLOW-UP:  Please make an appointment with your surgeon as instructed. You do not need to follow up with anesthesia unless specifically  instructed to do so.  WOUND CARE INSTRUCTIONS (if applicable):  Keep a dry clean dressing on the anesthesia/puncture wound site if there is drainage.  Once the wound has quit draining you may leave it open to air.  Generally you should leave the bandage intact for twenty four hours unless there is drainage.  If the epidural site drains for more than 36-48 hours please call the anesthesia department.  QUESTIONS?:  Please feel free to call your physician or the hospital operator if you have any questions, and they will be happy to assist you.

## 2013-05-06 NOTE — Progress Notes (Signed)
Patient ID: [name], female   DOB: 01/05/1936, 77 y.o.   MRN: [id number] Chief complaint pain and swelling right leg and ankle. The patient is scheduled to have surgery on her right knee status post open treatment internal fixation of patella fracture now has painful hardware which will be removed she also has had some bursitis in the knee treated by injection  She presents now with right leg swelling in a cyst which she thinks is formed in the leg but actually has bilateral lower extremity edema with tenderness in the tibial area and swelling. Symptoms started on Wednesday which is yesterday.  Review of systems positive for redness of the legs and the skin in the lower part of the ankle and foot  Other review of systems currently negative  She is allergic to amoxicillin. Family History  Problem Relation Age of Onset  . Stroke Mother   . Heart disease Father   . Breast cancer Sister    History  Substance Use Topics  . Smoking status: Never Smoker   . Smokeless tobacco: Never Used  . Alcohol Use: No   Past Medical History  Diagnosis Date  . Hypertension   . Hyperlipidemia   . Breast calcifications     right  . Cancer of upper-outer quadrant of female breast 09/24/2011    surg   . Pneumonia     at age 86    . Glaucoma     Her exam shows the following BP 114/68  Ht 5\' 1"  (1.549 m)  Wt 198 lb (89.812 kg)  BMI 37.43 kg/m2 General appearance is normal, the patient is alert and oriented x3 with normal mood and affect. She's walking with a cane no noticeable limp. No lymphadenopathy but she has bilateral lower extremity edema with tenderness over the tibia and redness and warmth. Her knee incision is clean dry and intact with no sign of infection skin is as stated. Muscle tone is normal knee joint is stable dorsal pulses are intact   impression is bilateral pitting edema with some inflammation  We will treat her with Cipro 500 mg twice a day secondary to amoxicillin allergy,  bilateral TED hose. We will increase her fluid pill when she calls me back with the dose  I don't think this will compromise her surgery.

## 2013-05-06 NOTE — Telephone Encounter (Signed)
Patellar happy to look at it if needed there is no redness or signs of infection it should be fine

## 2013-05-06 NOTE — Telephone Encounter (Signed)
Myles Gip called for [name], said she saw Alois last night and there is a place about the size of a dime on the outside of her right leg about half way down  That looks like a small hematoma.  Paitynn said there is some pain , but not bad.  Thought you should know since she is scheduled for surgery soon. Yolanda's # [phone]

## 2013-05-07 ENCOUNTER — Encounter (HOSPITAL_COMMUNITY)
Admission: RE | Admit: 2013-05-07 | Discharge: 2013-05-07 | Disposition: A | Discharge status: Home / Self Care | Payer: Medicare Other | Source: Ambulatory Visit | Attending: Orthopedic Surgery | Admitting: Orthopedic Surgery

## 2013-05-07 ENCOUNTER — Encounter (INDEPENDENT_AMBULATORY_CARE_PROVIDER_SITE_OTHER): Payer: Self-pay | Admitting: Surgery

## 2013-05-07 ENCOUNTER — Encounter (HOSPITAL_COMMUNITY): Payer: Self-pay

## 2013-05-07 HISTORY — DX: Type 2 diabetes mellitus without complications: E11.9

## 2013-05-07 HISTORY — DX: Unspecified glaucoma: H40.9

## 2013-05-07 LAB — BASIC METABOLIC PANEL
BUN: 15 mg/dL (ref 6–23)
Chloride: 106 mEq/L (ref 96–112)
Creatinine, Ser: 0.64 mg/dL (ref 0.50–1.10)
GFR calc Af Amer: >90 mL/min (ref >90)
Glucose, Bld: 94 mg/dL (ref 70–99)
Potassium: 4.7 mEq/L (ref 3.5–5.1)

## 2013-05-07 LAB — HEMOGLOBIN AND HEMATOCRIT, BLOOD: Hemoglobin: 12.9 g/dL (ref 12.0–15.0)

## 2013-05-07 NOTE — Telephone Encounter (Signed)
Forwarding to Dr. Romeo Apple (per his instructions at office visit 05/06/13)

## 2013-05-07 NOTE — Telephone Encounter (Signed)
Tell her to keep the same dose just make sure she is taking it

## 2013-05-07 NOTE — Telephone Encounter (Signed)
Left voice message for patient

## 2013-05-10 NOTE — Telephone Encounter (Signed)
Spoke with patient and relayed to her Dr. Mort Sawyers reply.

## 2013-05-11 ENCOUNTER — Telehealth: Payer: Self-pay | Admitting: Orthopedic Surgery

## 2013-05-11 NOTE — Telephone Encounter (Signed)
Cont'd:  Per Medicare guidelines, no pre-authorization required for surgery, CPT 20680, scheduled for 05/14/13.              Per [employer], supplemental insurance, no pre-authorization required, per representative Kriste Basque S; her name and date of call for reference, 05/10/13.

## 2013-05-11 NOTE — Telephone Encounter (Signed)
05/10/13-Regarding pre-authorization for out-patient surgery scheduled Per Medicare

## 2013-05-13 NOTE — H&P (Signed)
Chief complaint right knee pain  77 year old female status post open treatment internal fixation of comminuted patellar fracture on 03/04/2012. The patient did well recovered fracture healed patient regain functional ambulation but presents with painful hardware primarily over the superior aspect of the wound. She will be admitted through same-day surgery for same-day removal of hardware.  Review of symptoms mild arthritic symptoms. Some vision changes some allergy reactions to seasonal environment  Otherwise normal  Past Medical History  Diagnosis Date  . Hypertension   . Hyperlipidemia   . Breast calcifications     right  . Pneumonia     at age 31    . Glaucoma   . Glaucoma   . Cancer of upper-outer quadrant of female breast 09/24/2011    surg -right  . Diabetes mellitus without complication    Past Surgical History  Procedure Laterality Date  . Appendectomy  1954  . Abdominal hysterectomy  1983  . Rotator cuff repair  2010    left  . Breast biopsy  1980    right breast  . Breast lumpectomy w/ needle localization  09/24/2011    Right Dr Jamey Ripa  . Breast cyst excision  10/14/2011    Procedure: CYST EXCISION BREAST;  Surgeon: Currie Paris, MD;  Location: [facility];  Service: General;  Laterality: Right;  Re-excision of right breast cancer  . Orif patella  03/04/2012    Procedure: OPEN REDUCTION INTERNAL (ORIF) FIXATION PATELLA;  Surgeon: Vickki Hearing, MD;  Location: [facility];  Service: Orthopedics;  Laterality: Right;  . Tonsillectomy      as young adult   Family History  Problem Relation Age of Onset  . Stroke Mother   . Heart disease Father   . Breast cancer Sister    History  Substance Use Topics  . Smoking status: Never Smoker   . Smokeless tobacco: Never Used  . Alcohol Use: No   No current facility-administered medications for this encounter. Current outpatient prescriptions:anastrozole (ARIMIDEX) 1 MG tablet, Take 1 mg by mouth  daily., Disp: , Rfl: ;  aspirin (BAYER ASPIRIN) 325 MG tablet, Take 325 mg by mouth daily. , Disp: , Rfl: ;  bimatoprost (LUMIGAN) 0.01 % SOLN, Place 1 drop into both eyes daily., Disp: , Rfl: ;  calcium carbonate 200 MG capsule, Take 333 mg by mouth 2 (two) times daily with a meal., Disp: , Rfl:  Cholecalciferol (VITAMIN D-3 PO), Take 1,000 Units by mouth daily., Disp: , Rfl: ;  Cyanocobalamin (B-12 PO), Take 500 mg by mouth daily., Disp: , Rfl: ;  enalapril (VASOTEC) 10 MG tablet, Take 10 mg by mouth daily.  , Disp: , Rfl: ;  enoxaparin (LOVENOX) 30 MG/0.3ML injection, Inject 0.3 mLs (30 mg total) into the skin every 12 (twelve) hours., Disp: 14 Syringe, Rfl: 0 folic acid (FOLVITE) 800 MCG tablet, Take 400 mcg by mouth daily., Disp: , Rfl: ;  Ginkgo Biloba (GINKOBA PO), Take 120 mg by mouth daily., Disp: , Rfl: ;  Omega-3 Fatty Acids (FISH OIL) 1000 MG CAPS, Take 1 capsule by mouth daily. , Disp: , Rfl: ;  pioglitazone (ACTOS) 45 MG tablet, Take 45 mg by mouth daily.  , Disp: , Rfl: ;  potassium chloride SA (K-DUR,KLOR-CON) 20 MEQ tablet, Take 20 mEq by mouth 2 (two) times daily., Disp: , Rfl:  rosuvastatin (CRESTOR) 40 MG tablet, Take 40 mg by mouth daily.  , Disp: , Rfl: ;  timolol (TIMOPTIC) 0.5 % ophthalmic solution, Place  1 drop into both eyes daily., Disp: , Rfl: ;  triamterene-hydrochlorothiazide (DYAZIDE) 37.5-25 MG per capsule, Take 1 capsule by mouth every morning.  , Disp: , Rfl: ;  vitamin C (ASCORBIC ACID) 500 MG tablet, Take 500 mg by mouth daily., Disp: , Rfl:  vitamin E 400 UNIT capsule, Take 400 Units by mouth daily., Disp: , Rfl: ;  ciprofloxacin (CIPRO) 500 MG tablet, Take 1 tablet (500 mg total) by mouth 2 (two) times daily., Disp: 20 tablet, Rfl: 1  Vital signs have been stable in the office, general appearance is normal she is oriented x3 her mood and affect is normal. She ambulates reasonably well occasionally with the use of a cane no significant limp. Neck is supple. Back is  nontender in the thoracic region mild lower back tenderness  Right and left upper extremities no tenderness range of motion functional joints stable strength normal skin intact pulses good no lymphadenopathy normal sensation negative for pathologic reflexes overall coordination normal  Left lower extremity no joint effusion range of motion is normal stability tests are normal strength is normal skin is normal pulses are normal. Groin nodes are negative sensations intact no pathologic reflexes balance not tested coordination is normal  Right knee there is tenderness over the superior portion of the patella some of the hardware is palpable. Flexion of the knees 120 the knee is stable muscle tone is normal she has good straight leg raise her skin incision has healed with some hypertrophy. Distal pulses are intact minimal edema no swelling lymph nodes are negative sensations intact right reflexes are normal and coordination is normal  X-rays show hardware intact with a healed patellar fracture mild arthritic changes in the tibiofemoral joint  Impression painful hardware status post patella fracture  Recommend removal of hardware right knee

## 2013-05-14 ENCOUNTER — Encounter (HOSPITAL_COMMUNITY): Payer: Self-pay | Admitting: Anesthesiology

## 2013-05-14 ENCOUNTER — Encounter (HOSPITAL_COMMUNITY): Payer: Self-pay | Admitting: *Deleted

## 2013-05-14 ENCOUNTER — Ambulatory Visit (HOSPITAL_COMMUNITY): Payer: Medicare Other | Admitting: Anesthesiology

## 2013-05-14 ENCOUNTER — Ambulatory Visit (HOSPITAL_COMMUNITY): Payer: Medicare Other

## 2013-05-14 ENCOUNTER — Ambulatory Visit (HOSPITAL_COMMUNITY)
Admission: RE | Admit: 2013-05-14 | Discharge: 2013-05-14 | Disposition: A | Discharge status: Home / Self Care | Payer: Medicare Other | Source: Ambulatory Visit | Attending: Orthopedic Surgery | Admitting: Orthopedic Surgery

## 2013-05-14 ENCOUNTER — Encounter (HOSPITAL_COMMUNITY)
Admission: RE | Disposition: A | Discharge status: Home / Self Care | Payer: Self-pay | Source: Ambulatory Visit | Attending: Orthopedic Surgery

## 2013-05-14 DIAGNOSIS — Z9889 Other specified postprocedural states: Secondary | ICD-10-CM

## 2013-05-14 DIAGNOSIS — S82001S Unspecified fracture of right patella, sequela: Secondary | ICD-10-CM

## 2013-05-14 DIAGNOSIS — E785 Hyperlipidemia, unspecified: Secondary | ICD-10-CM | POA: Insufficient documentation

## 2013-05-14 DIAGNOSIS — M25569 Pain in unspecified knee: Secondary | ICD-10-CM

## 2013-05-14 DIAGNOSIS — Z01812 Encounter for preprocedural laboratory examination: Secondary | ICD-10-CM | POA: Insufficient documentation

## 2013-05-14 DIAGNOSIS — S8290XS Unspecified fracture of unspecified lower leg, sequela: Secondary | ICD-10-CM

## 2013-05-14 DIAGNOSIS — Z472 Encounter for removal of internal fixation device: Secondary | ICD-10-CM | POA: Insufficient documentation

## 2013-05-14 DIAGNOSIS — Z969 Presence of functional implant, unspecified: Secondary | ICD-10-CM

## 2013-05-14 DIAGNOSIS — Z01810 Encounter for preprocedural cardiovascular examination: Secondary | ICD-10-CM | POA: Insufficient documentation

## 2013-05-14 DIAGNOSIS — I10 Essential (primary) hypertension: Secondary | ICD-10-CM | POA: Insufficient documentation

## 2013-05-14 DIAGNOSIS — M25561 Pain in right knee: Secondary | ICD-10-CM

## 2013-05-14 DIAGNOSIS — E119 Type 2 diabetes mellitus without complications: Secondary | ICD-10-CM | POA: Insufficient documentation

## 2013-05-14 HISTORY — PX: HARDWARE REMOVAL: SHX979

## 2013-05-14 LAB — GLUCOSE, CAPILLARY: Glucose-Capillary: 118 mg/dL — ABNORMAL HIGH (ref 70–99)

## 2013-05-14 SURGERY — REMOVAL, HARDWARE
Anesthesia: General | Site: Knee | Laterality: Right | Wound class: Clean

## 2013-05-14 MED ORDER — DEXTROSE 50 % IV SOLN
12.5000 g | Freq: Once | INTRAVENOUS | Status: AC
  Administered 2013-05-14: 12.5 g via INTRAVENOUS

## 2013-05-14 MED ORDER — MIDAZOLAM HCL 5 MG/5ML IJ SOLN
INTRAMUSCULAR | Status: DC | PRN
  Administered 2013-05-14: 2 mg via INTRAVENOUS

## 2013-05-14 MED ORDER — ONDANSETRON HCL 4 MG/2ML IJ SOLN
INTRAMUSCULAR | Status: AC
  Filled 2013-05-14: qty 2

## 2013-05-14 MED ORDER — PROPOFOL 10 MG/ML IV BOLUS
INTRAVENOUS | Status: DC | PRN
  Administered 2013-05-14: 130 mg via INTRAVENOUS
  Administered 2013-05-14: 30 mg via INTRAVENOUS

## 2013-05-14 MED ORDER — FENTANYL CITRATE 0.05 MG/ML IJ SOLN
INTRAMUSCULAR | Status: AC
  Filled 2013-05-14: qty 2

## 2013-05-14 MED ORDER — DEXTROSE 5 % IV SOLN
INTRAVENOUS | Status: DC | PRN
  Administered 2013-05-14: 07:00:00 via INTRAVENOUS

## 2013-05-14 MED ORDER — VANCOMYCIN HCL IN DEXTROSE 1-5 GM/200ML-% IV SOLN
1000.0000 mg | INTRAVENOUS | Status: AC
  Administered 2013-05-14: 1000 mg via INTRAVENOUS

## 2013-05-14 MED ORDER — PROPOFOL 10 MG/ML IV EMUL
INTRAVENOUS | Status: AC
  Filled 2013-05-14: qty 20

## 2013-05-14 MED ORDER — ONDANSETRON HCL 4 MG/2ML IJ SOLN
4.0000 mg | Freq: Once | INTRAMUSCULAR | Status: AC
  Administered 2013-05-14: 4 mg via INTRAVENOUS

## 2013-05-14 MED ORDER — FENTANYL CITRATE 0.05 MG/ML IJ SOLN
INTRAMUSCULAR | Status: DC | PRN
  Administered 2013-05-14: 75 ug via INTRAVENOUS
  Administered 2013-05-14: 50 ug via INTRAVENOUS
  Administered 2013-05-14 (×3): 25 ug via INTRAVENOUS

## 2013-05-14 MED ORDER — ONDANSETRON HCL 4 MG/2ML IJ SOLN: 4.0000 mg | Freq: Once | INTRAMUSCULAR | Status: DC | PRN

## 2013-05-14 MED ORDER — CHLORHEXIDINE GLUCONATE 4 % EX LIQD: 60.0000 mL | Freq: Once | CUTANEOUS | Status: DC

## 2013-05-14 MED ORDER — MIDAZOLAM HCL 2 MG/2ML IJ SOLN
1.0000 mg | INTRAMUSCULAR | Status: DC | PRN
  Administered 2013-05-14: 2 mg via INTRAVENOUS

## 2013-05-14 MED ORDER — SODIUM CHLORIDE 0.9 % IR SOLN
Status: DC | PRN
  Administered 2013-05-14: 1000 mL

## 2013-05-14 MED ORDER — BUPIVACAINE-EPINEPHRINE PF 0.5-1:200000 % IJ SOLN
INTRAMUSCULAR | Status: AC
  Filled 2013-05-14: qty 20

## 2013-05-14 MED ORDER — VANCOMYCIN HCL IN DEXTROSE 1-5 GM/200ML-% IV SOLN
INTRAVENOUS | Status: AC
  Filled 2013-05-14: qty 200

## 2013-05-14 MED ORDER — LACTATED RINGERS IV SOLN
INTRAVENOUS | Status: DC
  Administered 2013-05-14: 07:00:00 via INTRAVENOUS

## 2013-05-14 MED ORDER — MIDAZOLAM HCL 2 MG/2ML IJ SOLN
INTRAMUSCULAR | Status: AC
  Filled 2013-05-14: qty 2

## 2013-05-14 MED ORDER — BUPIVACAINE-EPINEPHRINE 0.5% -1:200000 IJ SOLN
INTRAMUSCULAR | Status: DC | PRN
  Administered 2013-05-14: 60 mL

## 2013-05-14 MED ORDER — FENTANYL CITRATE 0.05 MG/ML IJ SOLN
25.0000 ug | INTRAMUSCULAR | Status: DC | PRN
  Administered 2013-05-14: 25 ug via INTRAVENOUS
  Administered 2013-05-14: 50 ug via INTRAVENOUS

## 2013-05-14 MED ORDER — DEXTROSE 50 % IV SOLN
INTRAVENOUS | Status: AC
  Filled 2013-05-14: qty 50

## 2013-05-14 MED ORDER — HYDROCODONE-ACETAMINOPHEN 5-325 MG PO TABS: 1.0000 | ORAL_TABLET | ORAL | Status: DC | PRN

## 2013-05-14 SURGICAL SUPPLY — 57 items
BAG HAMPER (MISCELLANEOUS) ×2 IMPLANT
BANDAGE ELASTIC 6 VELCRO NS (GAUZE/BANDAGES/DRESSINGS) ×1 IMPLANT
BANDAGE ESMARK 4X12 BL STRL LF (DISPOSABLE) ×1 IMPLANT
BLADE KNIFE PERSONA 15 (BLADE) ×2 IMPLANT
BLADE SURG SZ10 CARB STEEL (BLADE) ×2 IMPLANT
BNDG CMPR 12X4 ELC STRL LF (DISPOSABLE) ×1
BNDG COHESIVE 4X5 TAN STRL (GAUZE/BANDAGES/DRESSINGS) ×1 IMPLANT
BNDG ESMARK 4X12 BLUE STRL LF (DISPOSABLE) ×2
CHLORAPREP W/TINT 26ML (MISCELLANEOUS) ×3 IMPLANT
CLOTH BEACON ORANGE TIMEOUT ST (SAFETY) ×2 IMPLANT
COVER LIGHT HANDLE STERIS (MISCELLANEOUS) ×4 IMPLANT
COVER MAYO STAND XLG (DRAPE) ×1 IMPLANT
CUFF TOURNIQUET SINGLE 24IN (TOURNIQUET CUFF) IMPLANT
CUFF TOURNIQUET SINGLE 34IN LL (TOURNIQUET CUFF) ×2 IMPLANT
CUFF TOURNIQUET SINGLE 44IN (TOURNIQUET CUFF) ×1 IMPLANT
DECANTER SPIKE VIAL GLASS SM (MISCELLANEOUS) ×3 IMPLANT
DRAPE C-ARM FOLDED MOBILE STRL (DRAPES) ×1 IMPLANT
DRAPE OEC MINIVIEW 54X84 (DRAPES) IMPLANT
ELECT REM PT RETURN 9FT ADLT (ELECTROSURGICAL) ×2
ELECTRODE REM PT RTRN 9FT ADLT (ELECTROSURGICAL) ×1 IMPLANT
GAUZE XEROFORM 5X9 LF (GAUZE/BANDAGES/DRESSINGS) ×1 IMPLANT
GLOVE BIOGEL PI IND STRL 7.0 (GLOVE) IMPLANT
GLOVE BIOGEL PI INDICATOR 7.0 (GLOVE) ×3
GLOVE ECLIPSE 6.5 STRL STRAW (GLOVE) ×1 IMPLANT
GLOVE SKINSENSE NS SZ7.5 (GLOVE) ×1
GLOVE SKINSENSE NS SZ8.0 LF (GLOVE) ×1
GLOVE SKINSENSE STRL SZ7.5 (GLOVE) IMPLANT
GLOVE SKINSENSE STRL SZ8.0 LF (GLOVE) ×1 IMPLANT
GLOVE SS N UNI LF 8.5 STRL (GLOVE) ×2 IMPLANT
GOWN STRL REIN XL XLG (GOWN DISPOSABLE) ×7 IMPLANT
INST SET MINOR BONE (KITS) ×2 IMPLANT
KIT ROOM TURNOVER APOR (KITS) ×2 IMPLANT
MANIFOLD NEPTUNE II (INSTRUMENTS) ×2 IMPLANT
NDL HYPO 21X1 ECLIPSE (NEEDLE) ×1 IMPLANT
NDL HYPO 21X1.5 SAFETY (NEEDLE) ×1 IMPLANT
NDL HYPO 25X1 1.5 SAFETY (NEEDLE) IMPLANT
NEEDLE HYPO 21X1 ECLIPSE (NEEDLE) ×2 IMPLANT
NEEDLE HYPO 21X1.5 SAFETY (NEEDLE) ×2 IMPLANT
NEEDLE HYPO 25X1 1.5 SAFETY (NEEDLE) ×2 IMPLANT
NS IRRIG 1000ML POUR BTL (IV SOLUTION) ×2 IMPLANT
PACK BASIC LIMB (CUSTOM PROCEDURE TRAY) ×2 IMPLANT
PAD ABD 5X9 TENDERSORB (GAUZE/BANDAGES/DRESSINGS) ×2 IMPLANT
PAD ARMBOARD 7.5X6 YLW CONV (MISCELLANEOUS) ×2 IMPLANT
PAD CAST 4YDX4 CTTN HI CHSV (CAST SUPPLIES) ×1 IMPLANT
PADDING CAST COTTON 4X4 STRL (CAST SUPPLIES) ×2
SET BASIN LINEN APH (SET/KITS/TRAYS/PACK) ×2 IMPLANT
SPONGE GAUZE 4X4 12PLY (GAUZE/BANDAGES/DRESSINGS) ×1 IMPLANT
SPONGE LAP 18X18 X RAY DECT (DISPOSABLE) ×1 IMPLANT
SPONGE LAP 4X18 X RAY DECT (DISPOSABLE) ×1 IMPLANT
STAPLER VISISTAT 35W (STAPLE) ×1 IMPLANT
SUT ETHILON 3 0 FSL (SUTURE) ×1 IMPLANT
SUT MON AB 0 CT1 (SUTURE) ×2 IMPLANT
SUT MON AB 2-0 CT1 36 (SUTURE) ×3 IMPLANT
SUT PROLENE 4 0 PS 2 18 (SUTURE) IMPLANT
SYR 30ML LL (SYRINGE) ×1 IMPLANT
SYR 3ML LL SCALE MARK (SYRINGE) ×2 IMPLANT
SYR BULB IRRIGATION 50ML (SYRINGE) ×3 IMPLANT

## 2013-05-14 NOTE — Anesthesia Procedure Notes (Signed)
Procedure Name: LMA Insertion Date/Time: 05/14/2013 7:37 AM Performed by: Despina Hidden Pre-anesthesia Checklist: Emergency Drugs available, Suction available, Patient being monitored and Patient identified Patient Re-evaluated:Patient Re-evaluated prior to inductionOxygen Delivery Method: Circle system utilized Preoxygenation: Pre-oxygenation with 100% oxygen Intubation Type: IV induction Ventilation: Mask ventilation without difficulty LMA Size: 3.0 Number of attempts: 1 Placement Confirmation: positive ETCO2 and breath sounds checked- equal and bilateral Tube secured with: Tape Dental Injury: Teeth and Oropharynx as per pre-operative assessment

## 2013-05-14 NOTE — Anesthesia Postprocedure Evaluation (Signed)
  Anesthesia Post-op Note  Patient: [name]  Procedure(s) Performed: Procedure(s): HARDWARE REMOVAL (Right)  Patient Location: PACU  Anesthesia Type:General  Level of Consciousness: awake, alert , oriented and patient cooperative  Airway and Oxygen Therapy: Patient Spontanous Breathing  Post-op Pain: 2 /10, mild  Post-op Assessment: Post-op Vital signs reviewed, Patient's Cardiovascular Status Stable, Respiratory Function Stable, Patent Airway, No signs of Nausea or vomiting and Pain level controlled  Post-op Vital Signs: Reviewed and stable  Complications: No apparent anesthesia complications

## 2013-05-14 NOTE — Op Note (Signed)
05/14/2013  8:54 AM  PATIENT:  [name]  77 y.o. female  PRE-OPERATIVE DIAGNOSIS:  Right Knee Pain  POST-OPERATIVE DIAGNOSIS:  Right Knee Pain Status post patella fracture right knee  PROCEDURE:  Procedure(s): HARDWARE REMOVAL (Right) patella  Operative findings patella fracture healed intact all hardware intact  Details of procedure The patient was identified in the preoperative holding area. The right knee was confirmed as surgical site marked by the patient and the surgeon. The chart was reviewed and updated. Patient was taken to the operating room and given general anesthesia. This was followed by administering vancomycin IV. This was amoxicillin allergy.  The right leg was prepped and draped sterilely from groin to toes. Timeout was completed.  The previous incision was used. Incision was made over the right knee down to the extensor mechanism. Full-thickness skin flaps were preserved. Sharp dissection was carried out to find all of the hardware prior to any of it being removed. Then each piece of hardware was removed and a portion of the #5 Tycron suture was removed. The knee had full range of motion the knee was stable.  Radiographs confirmed all hardware was removed.  The wound was irrigated with copious amounts of saline. The incisions in the tendon which were made longitudinally both quadriceps and patella were repaired with 0 Monocryl suture. 2-0 Monocryl was then used to close the tendinous tissue. The joint and the subcutaneous tissue was injected with 0.5% Marcaine 60 cc with epinephrine  Skin staples were used to close and reapproximate the skin edges  Sterile dressing was applied  Patient was extubated tourniquet was released the patient was taken recovery in stable condition SURGEON:  Surgeon(s) and Role:    * Stanley E Harrison, MD - Primary  PHYSICIAN ASSISTANT:   ASSISTANTS: HOLLY PROZTEK   ANESTHESIA:   general  EBL:  Total I/O In: 500 [I.V.:500] Out:  -   BLOOD ADMINISTERED:none  DRAINS: none   LOCAL MEDICATIONS USED:  MARCAINE  0.5% with epinephrine  and Amount: 60 ml  SPECIMEN:  No Specimen  DISPOSITION OF SPECIMEN:  N/A  COUNTS:  YES  TOURNIQUET:   Total Tourniquet Time Documented: Thigh (Right) - 56 minutes Total: Thigh (Right) - 56 minutes   DICTATION: .Dragon Dictation  PLAN OF CARE: Discharge to home after PACU  PATIENT DISPOSITION:  PACU - hemodynamically stable.   Delay start of Pharmacological VTE agent (>24hrs) due to surgical blood loss or risk of bleeding: not applicable  

## 2013-05-14 NOTE — Brief Op Note (Signed)
05/14/2013  8:54 AM  PATIENT:  [name]  77 y.o. female  PRE-OPERATIVE DIAGNOSIS:  Right Knee Pain  POST-OPERATIVE DIAGNOSIS:  Right Knee Pain Status post patella fracture right knee  PROCEDURE:  Procedure(s): HARDWARE REMOVAL (Right) patella  Operative findings patella fracture healed intact all hardware intact  Details of procedure The patient was identified in the preoperative holding area. The right knee was confirmed as surgical site marked by the patient and the surgeon. The chart was reviewed and updated. Patient was taken to the operating room and given general anesthesia. This was followed by administering vancomycin IV. This was amoxicillin allergy.  The right leg was prepped and draped sterilely from groin to toes. Timeout was completed.  The previous incision was used. Incision was made over the right knee down to the extensor mechanism. Full-thickness skin flaps were preserved. Sharp dissection was carried out to find all of the hardware prior to any of it being removed. Then each piece of hardware was removed and a portion of the #5 Tycron suture was removed. The knee had full range of motion the knee was stable.  Radiographs confirmed all hardware was removed.  The wound was irrigated with copious amounts of saline. The incisions in the tendon which were made longitudinally both quadriceps and patella were repaired with 0 Monocryl suture. 2-0 Monocryl was then used to close the tendinous tissue. The joint and the subcutaneous tissue was injected with 0.5% Marcaine 60 cc with epinephrine  Skin staples were used to close and reapproximate the skin edges  Sterile dressing was applied  Patient was extubated tourniquet was released the patient was taken recovery in stable condition SURGEON:  Surgeon(s) and Role:    * Vickki Hearing, MD - Primary  PHYSICIAN ASSISTANT:   ASSISTANTS: HOLLY PROZTEK   ANESTHESIA:   general  EBL:  Total I/O In: 500 [I.V.:500] Out:  -   BLOOD ADMINISTERED:none  DRAINS: none   LOCAL MEDICATIONS USED:  MARCAINE  0.5% with epinephrine  and Amount: 60 ml  SPECIMEN:  No Specimen  DISPOSITION OF SPECIMEN:  N/A  COUNTS:  YES  TOURNIQUET:   Total Tourniquet Time Documented: Thigh (Right) - 56 minutes Total: Thigh (Right) - 56 minutes   DICTATION: .Reubin Milan Dictation  PLAN OF CARE: Discharge to home after PACU  PATIENT DISPOSITION:  PACU - hemodynamically stable.   Delay start of Pharmacological VTE agent (>24hrs) due to surgical blood loss or risk of bleeding: not applicable

## 2013-05-14 NOTE — Interval H&P Note (Signed)
History and Physical Interval Note:  05/14/2013 7:19 AM  [name]  has presented today for surgery, with the diagnosis of Right Knee Pain  The various methods of treatment have been discussed with the patient and family. After consideration of risks, benefits and other options for treatment, the patient has consented to  Procedure(s): HARDWARE REMOVAL (Right) KNEE as a surgical intervention .  The patient's history has been reviewed, patient examined, no change in status, stable for surgery.  I have reviewed the patient's chart and labs.  Questions were answered to the patient's satisfaction.     Fuller Canada

## 2013-05-14 NOTE — Anesthesia Preprocedure Evaluation (Addendum)
Anesthesia Evaluation  Patient identified by MRN, date of birth, ID band Patient awake    Reviewed: Allergy & Precautions, H&P , NPO status , Patient's Chart, lab work & pertinent test results, reviewed documented beta blocker date and time   History of Anesthesia Complications Negative for: history of anesthetic complications  Airway Mallampati: II TM Distance: >3 FB Neck ROM: full    Dental no notable dental hx. (+) Partial Upper and Partial Lower   Pulmonary neg pulmonary ROS,  breath sounds clear to auscultation  Pulmonary exam normal       Cardiovascular hypertension, On Medications, On Home Beta Blockers and Pt. on medications Rhythm:Regular Rate:Normal     Neuro/Psych negative neurological ROS  negative psych ROS   GI/Hepatic negative GI ROS, Neg liver ROS,   Endo/Other  diabetes, Well Controlled, Type 2, Oral Hypoglycemic AgentsMorbid obesity  Renal/GU negative Renal ROS  negative genitourinary   Musculoskeletal   Abdominal   Peds  Hematology negative hematology ROS (+)   Anesthesia Other Findings   Reproductive/Obstetrics negative OB ROS                           Anesthesia Physical Anesthesia Plan  ASA: III  Anesthesia Plan: General   Post-op Pain Management:    Induction: Intravenous  Airway Management Planned: LMA  Additional Equipment:   Intra-op Plan:   Post-operative Plan: Extubation in OR  Informed Consent: I have reviewed the patients History and Physical, chart, labs and discussed the procedure including the risks, benefits and alternatives for the proposed anesthesia with the patient or authorized representative who has indicated his/her understanding and acceptance.     Plan Discussed with:   Anesthesia Plan Comments:         Anesthesia Quick Evaluation

## 2013-05-14 NOTE — Transfer of Care (Signed)
Immediate Anesthesia Transfer of Care Note  Patient: [name]  Procedure(s) Performed: Procedure(s): HARDWARE REMOVAL (Right)  Patient Location: PACU  Anesthesia Type:General  Level of Consciousness: awake, alert  and patient cooperative  Airway & Oxygen Therapy: Patient Spontanous Breathing and Patient connected to face mask oxygen  Post-op Assessment: Report given to PACU RN, Post -op Vital signs reviewed and stable and Patient moving all extremities  Post vital signs: Reviewed and stable  Complications: No apparent anesthesia complications

## 2013-05-17 ENCOUNTER — Encounter: Payer: Self-pay | Admitting: Orthopedic Surgery

## 2013-05-17 ENCOUNTER — Ambulatory Visit (INDEPENDENT_AMBULATORY_CARE_PROVIDER_SITE_OTHER): Payer: Medicare Other | Admitting: Orthopedic Surgery

## 2013-05-17 VITALS — BP 110/62 | Ht 61.0 in | Wt 198.0 lb

## 2013-05-17 DIAGNOSIS — Z969 Presence of functional implant, unspecified: Secondary | ICD-10-CM

## 2013-05-17 DIAGNOSIS — Z9889 Other specified postprocedural states: Secondary | ICD-10-CM

## 2013-05-17 DIAGNOSIS — S82001D Unspecified fracture of right patella, subsequent encounter for closed fracture with routine healing: Secondary | ICD-10-CM

## 2013-05-17 DIAGNOSIS — S8290XD Unspecified fracture of unspecified lower leg, subsequent encounter for closed fracture with routine healing: Secondary | ICD-10-CM

## 2013-05-17 NOTE — Progress Notes (Signed)
Patient ID: [name], female   DOB: [birth year], 77 y.o.   MRN: [id number] Chief Complaint  Patient presents with  . Follow-up    Post op 1 Removal of Hardware right patella  DOS 05/14/13    Dressing change and wound check. Wound is clean dry and intact without drainage  Recommend remove staples on the 19th, dressing changes every other day until then

## 2013-05-17 NOTE — Patient Instructions (Addendum)
Change dressing every other day   Use walker as needed

## 2013-05-27 ENCOUNTER — Encounter: Payer: Self-pay | Admitting: Orthopedic Surgery

## 2013-05-27 ENCOUNTER — Ambulatory Visit (INDEPENDENT_AMBULATORY_CARE_PROVIDER_SITE_OTHER): Payer: Medicare Other | Admitting: Orthopedic Surgery

## 2013-05-27 VITALS — BP 132/78 | Ht 61.0 in | Wt 198.0 lb

## 2013-05-27 DIAGNOSIS — S8290XD Unspecified fracture of unspecified lower leg, subsequent encounter for closed fracture with routine healing: Secondary | ICD-10-CM

## 2013-05-27 DIAGNOSIS — S82001D Unspecified fracture of right patella, subsequent encounter for closed fracture with routine healing: Secondary | ICD-10-CM

## 2013-05-27 DIAGNOSIS — Z969 Presence of functional implant, unspecified: Secondary | ICD-10-CM

## 2013-05-27 DIAGNOSIS — Z9889 Other specified postprocedural states: Secondary | ICD-10-CM

## 2013-05-27 NOTE — Progress Notes (Signed)
Patient ID: [name], female   DOB: 06-08-1936, 77 y.o.   MRN: [id number] Chief Complaint  Patient presents with  . Follow-up    Post op #2, Removal of hardware right patella. DOS 05-14-13.    Staples removed from the right knee incision which looks clean dry and intact without erythema or drainage  Knee flexion is about 85 he has full extension she's using a cane to ambulate  We applied Steri-Strips and a Tegaderm dressing. Keep that on for 48 hours return in 4 weeks for the next postop visit

## 2013-05-27 NOTE — Patient Instructions (Addendum)
keep dressing on for 48 hours return in 4 weeks

## 2013-06-24 ENCOUNTER — Ambulatory Visit: Payer: Medicare Other | Admitting: Orthopedic Surgery

## 2013-07-06 ENCOUNTER — Ambulatory Visit (INDEPENDENT_AMBULATORY_CARE_PROVIDER_SITE_OTHER): Payer: Medicare Other | Admitting: Orthopedic Surgery

## 2013-07-06 VITALS — BP 116/61 | Ht 61.0 in | Wt 198.0 lb

## 2013-07-06 DIAGNOSIS — S8290XD Unspecified fracture of unspecified lower leg, subsequent encounter for closed fracture with routine healing: Secondary | ICD-10-CM

## 2013-07-06 DIAGNOSIS — Z969 Presence of functional implant, unspecified: Secondary | ICD-10-CM

## 2013-07-06 DIAGNOSIS — S82001D Unspecified fracture of right patella, subsequent encounter for closed fracture with routine healing: Secondary | ICD-10-CM

## 2013-07-06 DIAGNOSIS — Z9889 Other specified postprocedural states: Secondary | ICD-10-CM

## 2013-07-06 NOTE — Patient Instructions (Addendum)
activities as tolerated 

## 2013-07-06 NOTE — Progress Notes (Signed)
Patient ID: [name], female   DOB: 01/05/1936, 77 y.o.   MRN: [id number] Chief Complaint  Patient presents with  . Follow-up    4 week recheck on right patella, post op. DOS 05-14-13.   Normal post op exam   activities as tolerated

## 2013-07-12 ENCOUNTER — Ambulatory Visit (INDEPENDENT_AMBULATORY_CARE_PROVIDER_SITE_OTHER): Payer: Medicare Other | Admitting: Surgery

## 2013-07-28 ENCOUNTER — Ambulatory Visit (INDEPENDENT_AMBULATORY_CARE_PROVIDER_SITE_OTHER): Payer: Medicare Other | Admitting: Surgery

## 2013-07-28 ENCOUNTER — Encounter (INDEPENDENT_AMBULATORY_CARE_PROVIDER_SITE_OTHER): Payer: Self-pay | Admitting: Surgery

## 2013-07-28 VITALS — BP 152/68 | HR 64 | Resp 16 | Ht 62.0 in | Wt 194.2 lb

## 2013-07-28 DIAGNOSIS — C50419 Malignant neoplasm of upper-outer quadrant of unspecified female breast: Secondary | ICD-10-CM

## 2013-07-28 DIAGNOSIS — C50411 Malignant neoplasm of upper-outer quadrant of right female breast: Secondary | ICD-10-CM

## 2013-07-28 DIAGNOSIS — Z853 Personal history of malignant neoplasm of breast: Secondary | ICD-10-CM

## 2013-07-28 NOTE — Patient Instructions (Signed)
Continue annual mammograms and followups in our office

## 2013-07-28 NOTE — Progress Notes (Signed)
NAME: Baylie E Bicknell       DOB: 1936/06/14           DATE: 07/28/2013       MRN: [id number]   ADLYNN LOWENSTEIN is a 77 y.o.Marland Kitchenfemale who presents for routine followup of her Stage I right breast cancer, UOQ, Receptor +, diagnosed in 2012 and treated with lumpectomy and radiation therapy, now on anti-estrogen therapy. She has no problems or concerns on either side, with the exception of some mild tenderness at the prior lumpectomy site.  PFSH: She has had no significant changes since the last visit here.  ROS: There have been no significant changes since the last visit here  EXAM:  VS: BP 152/68  Pulse 64  Resp 16  Ht 5\' 2"  (1.575 m)  Wt 194 lb 3.2 oz (88.089 kg)  BMI 35.51 kg/m2   General: The patient is alert, oriented, generally healthy appearing, NAD. Mood and affect are normal.  Breasts:  The right breast is smaller than the left.radiation changes are minimal. There is some ridging of the inframammary fold medially. The lumpectomy site is lateral and seems soft. There is nothing to suggest a recurrence. The left breast is completely normal.  Lymphatics: She has no axillary or supraclavicular adenopathy on either side  Extremities: Full ROM of the surgical side with no lymphedema noted. Data Reviewed: No new data, mammogram due in sept.  Impression: Doing well, with no evidence of recurrent cancer or new cancer   Plan: Will continue to follow up on an annual basis here.

## 2014-01-11 ENCOUNTER — Other Ambulatory Visit: Payer: Self-pay | Admitting: Internal Medicine

## 2014-01-11 DIAGNOSIS — Z853 Personal history of malignant neoplasm of breast: Secondary | ICD-10-CM

## 2014-06-07 ENCOUNTER — Ambulatory Visit (INDEPENDENT_AMBULATORY_CARE_PROVIDER_SITE_OTHER): Payer: Medicare Other | Admitting: Orthopedic Surgery

## 2014-06-07 ENCOUNTER — Ambulatory Visit (INDEPENDENT_AMBULATORY_CARE_PROVIDER_SITE_OTHER): Payer: Medicare Other

## 2014-06-07 VITALS — BP 121/65 | Ht 62.0 in | Wt 194.0 lb

## 2014-06-07 DIAGNOSIS — M7651 Patellar tendinitis, right knee: Secondary | ICD-10-CM

## 2014-06-07 DIAGNOSIS — M1711 Unilateral primary osteoarthritis, right knee: Secondary | ICD-10-CM

## 2014-06-07 DIAGNOSIS — M1710 Unilateral primary osteoarthritis, unspecified knee: Secondary | ICD-10-CM

## 2014-06-07 DIAGNOSIS — M7650 Patellar tendinitis, unspecified knee: Secondary | ICD-10-CM

## 2014-06-07 NOTE — Patient Instructions (Signed)
Get cream from [place] and use three times daily

## 2014-06-07 NOTE — Progress Notes (Signed)
Subjective:     Patient ID: [name], female   DOB: 27-Jul-1936, 78 y.o.   MRN: [id number]  Chief Complaint  Patient presents with  . Knee Pain    right knee pain and swelling    Knee Pain    78 years old status post patella fracture internal fixation 2013 followed by hardware removal 2014 presents with anterior knee pain and swelling. Complains of mild knee pain when walking increased knee pain when recumbent. Pain appears to be in complaint to be around the peripatellar region without tibiofemoral joint symptoms or mechanical symptoms of catching locking or giving way and her symptoms are all in the right knee. She does have some gait abnormality.  Review of Systems Musculoskeletal system is negative for radicular symptoms,. Neurologic symptoms negative. No mechanical symptoms in the knee.    Objective:   Physical Exam BP 121/65  Ht 5\' 2"  (1.575 m)  Wt 194 lb (87.998 kg)  BMI 35.47 kg/m2 General appearance is normal she is well-groomed and well-dressed. She is oriented x3. Her mood is pleasant. I do not detect any gait abnormality other than expected for age and previous knee surgery.  The knee area incision is healed but she is tender around the peripatellar region and she does have swelling in that area without joint effusion. This area is primarily the area of tenderness the joint lines are quiet. The knee remains stable her straight leg raise indicates good motor function in her quadriceps there is no extensor lag. Her skin is intact with no wound issues. 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 Do not feel surgery will be needed just need to get the right medication. I did tell her to use a cane to take some pressure off the knee when walking

## 2014-06-20 ENCOUNTER — Emergency Department (HOSPITAL_COMMUNITY)
Admission: EM | Admit: 2014-06-20 | Discharge: 2014-06-20 | Disposition: A | Discharge status: Home / Self Care | Payer: Medicare Other | Attending: Emergency Medicine | Admitting: Emergency Medicine

## 2014-06-20 ENCOUNTER — Encounter (HOSPITAL_COMMUNITY): Payer: Self-pay | Admitting: Emergency Medicine

## 2014-06-20 DIAGNOSIS — E785 Hyperlipidemia, unspecified: Secondary | ICD-10-CM | POA: Insufficient documentation

## 2014-06-20 DIAGNOSIS — Z7982 Long term (current) use of aspirin: Secondary | ICD-10-CM | POA: Insufficient documentation

## 2014-06-20 DIAGNOSIS — R55 Syncope and collapse: Secondary | ICD-10-CM | POA: Insufficient documentation

## 2014-06-20 DIAGNOSIS — E119 Type 2 diabetes mellitus without complications: Secondary | ICD-10-CM | POA: Insufficient documentation

## 2014-06-20 DIAGNOSIS — Z79899 Other long term (current) drug therapy: Secondary | ICD-10-CM | POA: Insufficient documentation

## 2014-06-20 DIAGNOSIS — H409 Unspecified glaucoma: Secondary | ICD-10-CM | POA: Insufficient documentation

## 2014-06-20 DIAGNOSIS — Z880 Allergy status to penicillin: Secondary | ICD-10-CM | POA: Insufficient documentation

## 2014-06-20 DIAGNOSIS — Z853 Personal history of malignant neoplasm of breast: Secondary | ICD-10-CM | POA: Insufficient documentation

## 2014-06-20 DIAGNOSIS — Z8701 Personal history of pneumonia (recurrent): Secondary | ICD-10-CM | POA: Insufficient documentation

## 2014-06-20 DIAGNOSIS — K921 Melena: Secondary | ICD-10-CM | POA: Insufficient documentation

## 2014-06-20 DIAGNOSIS — I10 Essential (primary) hypertension: Secondary | ICD-10-CM | POA: Insufficient documentation

## 2014-06-20 LAB — BASIC METABOLIC PANEL
Anion gap: 9 (ref 5–15)
BUN: 13 mg/dL (ref 6–23)
CHLORIDE: 103 meq/L (ref 96–112)
CO2: 29 mEq/L (ref 19–32)
Calcium: 10.1 mg/dL (ref 8.4–10.5)
Creatinine, Ser: 0.77 mg/dL (ref 0.50–1.10)
GFR calc non Af Amer: 80 mL/min — ABNORMAL LOW (ref >90)
GLUCOSE: 94 mg/dL (ref 70–99)
POTASSIUM: 4.1 meq/L (ref 3.7–5.3)
SODIUM: 141 meq/L (ref 137–147)

## 2014-06-20 LAB — CBC WITH DIFFERENTIAL/PLATELET
Basophils Absolute: 0 10*3/uL (ref 0.0–0.1)
Basophils Relative: 0 % (ref 0–1)
EOS ABS: 0.1 10*3/uL (ref 0.0–0.7)
Eosinophils Relative: 2 % (ref 0–5)
HCT: 38 % (ref 36.0–46.0)
HEMOGLOBIN: 12.6 g/dL (ref 12.0–15.0)
LYMPHS ABS: 1.6 10*3/uL (ref 0.7–4.0)
LYMPHS PCT: 42 % (ref 12–46)
MCH: 31.3 pg (ref 26.0–34.0)
MCHC: 33.2 g/dL (ref 30.0–36.0)
MCV: 94.3 fL (ref 78.0–100.0)
MONOS PCT: 9 % (ref 3–12)
Monocytes Absolute: 0.4 10*3/uL (ref 0.1–1.0)
NEUTROS PCT: 47 % (ref 43–77)
Neutro Abs: 1.8 10*3/uL (ref 1.7–7.7)
Platelets: 240 10*3/uL (ref 150–400)
RBC: 4.03 MIL/uL (ref 3.87–5.11)
RDW: 14.3 % (ref 11.5–15.5)
WBC: 3.8 10*3/uL — AB (ref 4.0–10.5)

## 2014-06-20 LAB — POC OCCULT BLOOD, ED: FECAL OCCULT BLD: POSITIVE — AB

## 2014-06-20 LAB — I-STAT TROPONIN, ED: TROPONIN I, POC: 0 ng/mL (ref 0.00–0.08)

## 2014-06-20 LAB — I-STAT CG4 LACTIC ACID, ED: Lactic Acid, Venous: 0.79 mmol/L (ref 0.5–2.2)

## 2014-06-20 MED ORDER — SODIUM CHLORIDE 0.9 % IV BOLUS (SEPSIS)
2000.0000 mL | Freq: Once | INTRAVENOUS | Status: AC
  Administered 2014-06-20: 2000 mL via INTRAVENOUS

## 2014-06-20 MED ORDER — SODIUM CHLORIDE 0.9 % IV BOLUS (SEPSIS): 500.0000 mL | Freq: Once | INTRAVENOUS | Status: DC

## 2014-06-20 NOTE — ED Provider Notes (Signed)
CSN: [id number]     Arrival date & time 06/20/14  1222 History  This chart was scribed for Babette Relic, MD by Elby Beck, ED Scribe. This patient was seen in room APA09/APA09 and the patient's care was started at 2:30 PM.   Chief Complaint  Patient presents with  . Dizziness    The history is provided by the patient. No language interpreter was used.    HPI Comments: [name] is a 78 y.o. female with a history of DM, HTN and prior breast CA who presents to the Emergency Department complaining of intermittent episodes of lightheadedness onset 3 days ago. She states that she has been having a few episodes of lightheadedness a day, and that the episodes generally last a few minutes. She states that the episodes seem to occur after she stands up. She denies experiencing any room-spinning sensations. She reports associated reduced appetite over the past 2 weeks. She also states that last week she had 1 episode of painless bright red blood per rectum. She denies any associated confusion, changes in speech or vision, dysphagia, lateralizing weakness or numbness, nausea, emesis, diarrhea or any other symptoms.    Past Medical History  Diagnosis Date  . Hypertension   . Hyperlipidemia   . Breast calcifications     right  . Pneumonia     at age 36    . Glaucoma   . Glaucoma   . Cancer of upper-outer quadrant of female breast 09/24/2011    surg -right  . Diabetes mellitus without complication    Past Surgical History  Procedure Laterality Date  . Appendectomy  1954  . Abdominal hysterectomy  1983  . Rotator cuff repair  2010    left  . Breast biopsy  1980    right breast  . Breast lumpectomy w/ needle localization  09/24/2011    Right Dr Margot Chimes  . Breast cyst excision  10/14/2011    Procedure: CYST EXCISION BREAST;  Surgeon: Haywood Lasso, MD;  Location: [facility];  Service: General;  Laterality: Right;  Re-excision of right breast cancer  . Orif patella   03/04/2012    Procedure: OPEN REDUCTION INTERNAL (ORIF) FIXATION PATELLA;  Surgeon: Carole Civil, MD;  Location: [facility];  Service: Orthopedics;  Laterality: Right;  . Tonsillectomy      as young adult  . Hardware removal Right 05/14/2013    Procedure: HARDWARE REMOVAL;  Surgeon: Carole Civil, MD;  Location: [facility];  Service: Orthopedics;  Laterality: Right;   Family History  Problem Relation Age of Onset  . Stroke Mother   . Heart disease Father   . Breast cancer Sister    History  Substance Use Topics  . Smoking status: Never Smoker   . Smokeless tobacco: Never Used  . Alcohol Use: No   OB History   Grav Para Term Preterm Abortions TAB SAB Ect Mult Living                 Review of Systems 10 Systems reviewed and all are negative for acute change except as noted in the HPI.  Allergies  Amoxicillin  Home Medications   Prior to Admission medications   Medication Sig Start Date End Date Taking? Authorizing Provider  anastrozole (ARIMIDEX) 1 MG tablet Take 1 mg by mouth daily.   Yes Historical Provider, MD  aspirin (BAYER ASPIRIN) 325 MG tablet Take 325 mg by mouth daily.    Yes Historical Provider, MD  bimatoprost (LUMIGAN) 0.01 % SOLN Place 1 drop into both eyes daily.   Yes Historical Provider, MD  calcium carbonate 200 MG capsule Take 200 mg by mouth 2 (two) times daily with a meal.    Yes Historical Provider, MD  Cholecalciferol (VITAMIN D-3 PO) Take 1,000 Units by mouth daily.   Yes Historical Provider, MD  Cyanocobalamin (B-12 PO) Take 500 mg by mouth daily.   Yes Historical Provider, MD  enalapril (VASOTEC) 10 MG tablet Take 10 mg by mouth daily.     Yes Historical Provider, MD  folic acid (FOLVITE) 932 MCG tablet Take 400 mcg by mouth daily.   Yes Historical Provider, MD  Ginkgo Biloba (GINKOBA PO) Take 120 mg by mouth daily.   Yes Historical Provider, MD  Omega-3 Fatty Acids (FISH OIL) 1000 MG CAPS Take 1 capsule by mouth daily.    Yes Historical Provider, MD   pioglitazone (ACTOS) 45 MG tablet Take 45 mg by mouth daily.     Yes Historical Provider, MD  potassium chloride SA (K-DUR,KLOR-CON) 20 MEQ tablet Take 20 mEq by mouth 2 (two) times daily.   Yes Historical Provider, MD  rosuvastatin (CRESTOR) 40 MG tablet Take 40 mg by mouth daily.     Yes Historical Provider, MD  timolol (TIMOPTIC) 0.5 % ophthalmic solution Place 1 drop into both eyes daily.   Yes Historical Provider, MD  triamterene-hydrochlorothiazide (DYAZIDE) 37.5-25 MG per capsule Take 1 capsule by mouth every morning.     Yes Historical Provider, MD  vitamin C (ASCORBIC ACID) 500 MG tablet Take 500 mg by mouth daily.   Yes Historical Provider, MD  vitamin E 400 UNIT capsule Take 400 Units by mouth daily.   Yes Historical Provider, MD   Triage Vitals: BP 140/70  Pulse 63  Temp(Src) 98 F (36.7 C) (Oral)  Resp 16  SpO2 100%  Physical Exam  Nursing note and vitals reviewed. Constitutional:  Awake, alert, nontoxic appearance with baseline speech for patient.  HENT:  Head: Atraumatic.  Mouth/Throat: No oropharyngeal exudate.  Eyes: EOM are normal. Pupils are equal, round, and reactive to light. Right eye exhibits no discharge. Left eye exhibits no discharge.  No nystagmus. Negative test of skew.   Neck: Neck supple.  Cardiovascular: Normal rate and regular rhythm.   No murmur heard. Pulmonary/Chest: Effort normal and breath sounds normal. No stridor. No respiratory distress. She has no wheezes. She has no rales. She exhibits no tenderness.  Abdominal: Soft. Bowel sounds are normal. She exhibits no mass. There is no tenderness. There is no rebound.  Musculoskeletal: She exhibits no tenderness.  Baseline ROM, moves extremities with no obvious new focal weakness. Chronic right shoulder pain with movement. Limited ROM.   Lymphadenopathy:    She has no cervical adenopathy.  Neurological:  Awake, alert, cooperative and aware of situation; motor strength bilaterally; sensation normal  to light touch bilaterally; peripheral visual fields full to confrontation; no facial asymmetry; tongue midline; major cranial nerves appear intact; no pronator drift, normal finger to nose bilaterally, baseline gait without new ataxia.  Skin: No rash noted.  Psychiatric: She has a normal mood and affect.    ED Course  Procedures (including critical care time)  DIAGNOSTIC STUDIES: Oxygen Saturation is 100% on RA, normal by my interpretation.    COORDINATION OF CARE: 2:40 PM- Patient / Family / Caregiver understand and agree with initial ED impression and plan with expectations set for ED visit.  Labs Review Labs Reviewed  BASIC METABOLIC PANEL -  Abnormal; Notable for the following:    GFR calc non Af Amer 80 (*)    All other components within normal limits  CBC WITH DIFFERENTIAL - Abnormal; Notable for the following:    WBC 3.8 (*)    All other components within normal limits  POC OCCULT BLOOD, ED - Abnormal; Notable for the following:    Fecal Occult Bld POSITIVE (*)    All other components within normal limits  I-STAT CG4 LACTIC ACID, ED  I-STAT TROPOININ, ED    Imaging Review No results found.   EKG Interpretation   Date/Time:  Monday June 20 2014 14:45:00 EDT Ventricular Rate:  62 PR Interval:  177 QRS Duration: 90 QT Interval:  425 QTC Calculation: 432 R Axis:   11 Text Interpretation:  Sinus arrhythmia ED PHYSICIAN INTERPRETATION  AVAILABLE IN [place] Confirmed by TEST, Record ([id number]) on  06/22/2014 12:27:14 PM      MDM   Final diagnoses:  Near syncope  Hematochezia    I doubt any other EMC precluding discharge at this time including, but not necessarily limited to the following:CVA, dysrhythmia, GI bleed requiring admit.  I personally performed the services described in this documentation, which was scribed in my presence. The recorded information has been reviewed and is accurate.   Babette Relic, MD 06/23/14 [id number]

## 2014-06-20 NOTE — ED Notes (Signed)
Pt A&O, NAD, denies dizziness. Pt and family educated on f/u needed. Pts IV d/c'd, guaze applied. Pt able to ambulate without difficulties. Given wheelchair out.

## 2014-06-20 NOTE — ED Notes (Signed)
C/o dizziness that started with yesterday along with abd pain as well.

## 2014-06-20 NOTE — Discharge Instructions (Signed)
Bloody Stools Bloody stools means there is blood in your poop (stool). It is a sign that there is a problem somewhere in the digestive system. It is important for your doctor to find the cause of your bleeding, so the problem can be treated.  HOME CARE  Only take medicine as told by your doctor.  Eat foods with fiber (prunes, bran cereals).  Drink enough fluids to keep your pee (urine) clear or pale yellow.  Sit in warm water (sitz bath) for 10 to 15 minutes as told by your doctor.  Know how to take your medicines (enemas, suppositories) if advised by your doctor.  Watch for signs that you are getting better or getting worse. GET HELP RIGHT AWAY IF:   You are not getting better.  You start to get better but then get worse again.  You have new problems.  You have severe bleeding from the place where poop comes out (rectum) that does not stop.  You throw up (vomit) blood.  You feel weak or pass out (faint).  You have a fever. MAKE SURE YOU:   Understand these instructions.  Will watch your condition.  Will get help right away if you are not doing well or get worse. Document Released: 11/13/2009 Document Revised: 02/17/2012 Document Reviewed: 04/12/2011 [facility] Patient Information 2015 [place], [place]. This information is not intended to replace advice given to you by your health care provider. Make sure you discuss any questions you have with your health care provider.  Your caregiver has seen you today because you are having problems with feelings of weakness, dizziness, and/or fatigue. Weakness has many different causes, some of which are common and others are very rare. Your caregiver has considered some of the most common causes of weakness and feels it is safe for you to go home and be observed. Not every illness or injury can be identified during an emergency department visit, thus follow-up with your primary healthcare provider is important. Medical conditions can also  worsen, so it is also important to return immediately as directed below, or if you have other serious concerns develop. RETURN IMMEDIATELY IF you develop new shortness of breath, chest pain, fever, have difficulty moving parts of your body (new weakness, numbness, or incoordination), sudden change in speech, vision, swallowing, or understanding, faint or develop new dizziness, severe headache, become poorly responsive or have an altered mental status compared to baseline for you, new rash, abdominal pain, or bloody stools,  Return sooner also if you develop new problems for which you have not talked to your caregiver but you feel may be emergency medical conditions, or are unable to be cared for safely at home.

## 2014-07-19 ENCOUNTER — Ambulatory Visit (INDEPENDENT_AMBULATORY_CARE_PROVIDER_SITE_OTHER): Payer: Medicare Other | Admitting: Orthopedic Surgery

## 2014-07-19 VITALS — BP 95/58 | Ht 62.0 in | Wt 194.0 lb

## 2014-07-19 DIAGNOSIS — M7651 Patellar tendinitis, right knee: Secondary | ICD-10-CM

## 2014-07-19 DIAGNOSIS — M7650 Patellar tendinitis, unspecified knee: Secondary | ICD-10-CM

## 2014-07-19 NOTE — Progress Notes (Signed)
This is a followup visit    Chief Complaint  Patient presents with  . Follow-up    6 week recheck Right knee    Peripatellar pain status post open treatment internal fixation patella subsequent hardware removal about a year later. She's on a topical combination preparation drug twice a day which is helping her lateral and in antero-inferior knee pain.  Review of systems negative  Osteoporosis noted on her bone density test with a 1.6 T score.   General the patient is well-developed and well-nourished grooming and hygiene are normal Oriented x3 Mood and affect normal Inspection of the right knee shows peripatellar tenderness primarily laterally and infero-anteriorly Range of motion 0-120 All joints are stable Motor exam is normal Skin clean dry and intact  Cardiovascular exam is normal Sensory exam normal   Peripatellar pain post surgery  Inject right knee parapatellar soft tissues  Procedure note Peripatellar pain Depo-Medrol 40, lidocaine 1% 3 cc Ethyl chloride and alcohol Timeout, verbal consent. Injection performed in the soft tissues of the lateral parapatellar area No complications

## 2014-07-19 NOTE — Patient Instructions (Signed)
Joint Injection  Care After  Refer to this sheet in the next few days. These instructions provide you with information on caring for yourself after you have had a joint injection. Your caregiver also may give you more specific instructions. Your treatment has been planned according to current medical practices, but problems sometimes occur. Call your caregiver if you have any problems or questions after your procedure.  After any type of joint injection, it is not uncommon to experience:  · Soreness, swelling, or bruising around the injection site.  · Mild numbness, tingling, or weakness around the injection site caused by the numbing medicine used before or with the injection.  It also is possible to experience the following effects associated with the specific agent after injection:  · Iodine-based contrast agents:  ¨ Allergic reaction (itching, hives, widespread redness, and swelling beyond the injection site).  · Corticosteroids (These effects are rare.):  ¨ Allergic reaction.  ¨ Increased blood sugar levels (If you have diabetes and you notice that your blood sugar levels have increased, notify your caregiver).  ¨ Increased blood pressure levels.  ¨ Mood swings.  · Hyaluronic acid in the use of viscosupplementation.  ¨ Temporary heat or redness.  ¨ Temporary rash and itching.  ¨ Increased fluid accumulation in the injected joint.  These effects all should resolve within a day after your procedure.   HOME CARE INSTRUCTIONS  · Limit yourself to light activity the day of your procedure. Avoid lifting heavy objects, bending, stooping, or twisting.  · Take prescription or over-the-counter pain medication as directed by your caregiver.  · You may apply ice to your injection site to reduce pain and swelling the day of your procedure. Ice may be applied 03-04 times:  ¨ Put ice in a plastic bag.  ¨ Place a towel between your skin and the bag.  ¨ Leave the ice on for no longer than 15-20 minutes each time.  SEEK  IMMEDIATE MEDICAL CARE IF:   · Pain and swelling get worse rather than better or extend beyond the injection site.  · Numbness does not go away.  · Blood or fluid continues to leak from the injection site.  · You have chest pain.  · You have swelling of your face or tongue.  · You have trouble breathing or you become dizzy.  · You develop a fever, chills, or severe tenderness at the injection site that last longer than 1 day.  MAKE SURE YOU:  · Understand these instructions.  · Watch your condition.  · Get help right away if you are not doing well or if you get worse.  Document Released: 08/08/2011 Document Revised: 02/17/2012 Document Reviewed: 08/08/2011  [employer]® Patient Information ©2015 [employer]. This information is not intended to replace advice given to you by your health care provider. Make sure you discuss any questions you have with your health care provider.

## 2014-08-03 ENCOUNTER — Encounter: Payer: Self-pay | Admitting: Gastroenterology

## 2014-08-03 ENCOUNTER — Encounter (INDEPENDENT_AMBULATORY_CARE_PROVIDER_SITE_OTHER): Payer: Self-pay

## 2014-08-03 ENCOUNTER — Ambulatory Visit (INDEPENDENT_AMBULATORY_CARE_PROVIDER_SITE_OTHER): Payer: Medicare Other | Admitting: Gastroenterology

## 2014-08-03 ENCOUNTER — Encounter (HOSPITAL_COMMUNITY): Payer: Self-pay | Admitting: Pharmacy Technician

## 2014-08-03 ENCOUNTER — Other Ambulatory Visit: Payer: Self-pay | Admitting: Gastroenterology

## 2014-08-03 VITALS — BP 134/80 | HR 68 | Temp 97.1°F | Ht 62.0 in | Wt 194.4 lb

## 2014-08-03 DIAGNOSIS — K625 Hemorrhage of anus and rectum: Secondary | ICD-10-CM | POA: Insufficient documentation

## 2014-08-03 DIAGNOSIS — R194 Change in bowel habit: Secondary | ICD-10-CM

## 2014-08-03 NOTE — Progress Notes (Signed)
CC TO PCP °

## 2014-08-03 NOTE — Patient Instructions (Addendum)
CLEAR LIQUIDS MON AUG 3`. COLONOSCOPY  TUE SEP 1.  HOLD ACTOS MORING OF PROCEDURE.

## 2014-08-03 NOTE — Assessment & Plan Note (Signed)
ASSOCIATED WITH CHANGE IN BOWEL HABITS.  TCS NEXT FRI.  DISCUSSED PROCEDURE, BENEFITS, & RISKS: < 1% chance of medication reaction, bleeding, perforation, or rupture of spleen/liver. MOVIPREP HOLD ACTOS AM OF TCS. FOLLOW UP IN 4 MOS.  TBS AFTER TCS

## 2014-08-03 NOTE — Progress Notes (Signed)
Subjective:    Patient ID: [name], female    DOB: 08-09-1936, 78 y.o.   MRN: [id number]  DAVIDSON,ERIC, MD  HPI BEFORE 6 weeks ago she was regular. Seeing blood in stoOl q1-2 weeks for past 6 weeks. NO ABDOMINAL PAIN JUST BLOATING. NO WEIGHT OR APPETITE LOSS. HAD STOOL TEST ~ 6 WEEKS AGO. NO NEW MEDS. BEEN DIABETIC FOR > 10 YEARS.  PT DENIES FEVER, CHILLS, HEMATEMESIS, nausea, vomiting, melena, diarrhea, CHEST PAIN, SHORTNESS OF BREATH,  abdominal pain, problems swallowing, problems with sedation, OR heartburn or indigestion.   Past Medical History  Diagnosis Date  . Hypertension   . Hyperlipidemia   . Breast calcifications     right  . Pneumonia     at age 73    . Glaucoma   . Glaucoma   . Cancer of upper-outer quadrant of female breast 09/24/2011    surg -right  . Diabetes mellitus without complication    Past Surgical History  Procedure Laterality Date  . Appendectomy  1954  . Abdominal hysterectomy  1983  . Rotator cuff repair  2010    left  . Breast biopsy  1980    right breast  . Breast lumpectomy w/ needle localization  09/24/2011    Right Dr Margot Chimes  . Breast cyst excision  10/14/2011    Procedure: CYST EXCISION BREAST;  Surgeon: Haywood Lasso, MD;  Location: [facility];  Service: General;  Laterality: Right;  Re-excision of right breast cancer  . Orif patella  03/04/2012    Procedure: OPEN REDUCTION INTERNAL (ORIF) FIXATION PATELLA;  Surgeon: Carole Civil, MD;  Location: [facility];  Service: Orthopedics;  Laterality: Right;  . Tonsillectomy      as young adult  . Hardware removal Right 05/14/2013    Procedure: HARDWARE REMOVAL;  Surgeon: Carole Civil, MD;  Location: [facility];  Service: Orthopedics;  Laterality: Right;   Allergies  Allergen Reactions  . Amoxicillin Other (See Comments)    Patient does not recall reaction.    Current Outpatient Prescriptions  Medication Sig Dispense Refill  . anastrozole (ARIMIDEX) 1 MG tablet  Take 1 mg by mouth daily.      Marland Kitchen aspirin (BAYER ASPIRIN) 325 MG tablet Take 325 mg by mouth daily.       . bimatoprost (LUMIGAN) 0.01 % SOLN Place 1 drop into both eyes daily.      . calcium carbonate 200 MG capsule Take 200 mg by mouth 2 (two) times daily with a meal.       . Cholecalciferol (VITAMIN D-3 PO) Take 1,000 Units by mouth daily.      . Cyanocobalamin (B-12 PO) Take 500 mg by mouth daily.      . enalapril (VASOTEC) 10 MG tablet Take 10 mg by mouth daily.        . folic acid (FOLVITE) 426 MCG tablet Take 400 mcg by mouth daily.      . Ginkgo Biloba (GINKOBA PO) Take 120 mg by mouth daily.      . Omega-3 Fatty Acids (FISH OIL) 1000 MG CAPS Take 1 capsule by mouth daily.       . pioglitazone (ACTOS) 45 MG tablet Take 45 mg by mouth daily.        . potassium chloride SA (K-DUR,KLOR-CON) 20 MEQ tablet Take 20 mEq by mouth 2 (two) times daily.      . rosuvastatin (CRESTOR) 40 MG tablet Take 40 mg by mouth daily.        Marland Kitchen  timolol (TIMOPTIC) 0.5 % ophthalmic solution Place 1 drop into both eyes daily.      Marland Kitchen triamterene-hydrochlorothiazide (DYAZIDE) 37.5-25 MG per capsule Take 1 capsule by mouth every morning.        . vitamin C (ASCORBIC ACID) 500 MG tablet Take 500 mg by mouth daily.      . vitamin E 400 UNIT capsule Take 400 Units by mouth daily.       Family History  Problem Relation Age of Onset  . Stroke Mother   . Heart disease Father   . Breast cancer Sister   . Colon cancer Neg Hx   . Colon polyps Neg Hx      History  Substance Use Topics  . Smoking status: Never Smoker   . Smokeless tobacco: Never Used  . Alcohol Use: No      Review of Systems PER HPI OTHERWISE ALL SYSTEMS ARE NEGATIVE.     Objective:   Physical Exam  Vitals reviewed. Constitutional: She is oriented to person, place, and time. She appears well-developed and well-nourished. No distress.  HENT:  Head: Normocephalic and atraumatic.  Mouth/Throat: Oropharynx is clear and moist. No  oropharyngeal exudate.  Eyes: Pupils are equal, round, and reactive to light. No scleral icterus.  Neck: Normal range of motion. Neck supple.  Cardiovascular: Normal rate, regular rhythm and normal heart sounds.   Pulmonary/Chest: Effort normal and breath sounds normal. No respiratory distress.  Abdominal: Soft. Bowel sounds are normal. She exhibits no distension. There is no tenderness.  Musculoskeletal: She exhibits no edema.  Lymphadenopathy:    She has no cervical adenopathy.  Neurological: She is alert and oriented to person, place, and time.  Psychiatric: She has a normal mood and affect.          Assessment & Plan:

## 2014-08-09 ENCOUNTER — Ambulatory Visit (HOSPITAL_COMMUNITY)
Admission: RE | Admit: 2014-08-09 | Discharge: 2014-08-09 | Disposition: A | Discharge status: Home / Self Care | Payer: Medicare Other | Source: Ambulatory Visit | Attending: Gastroenterology | Admitting: Gastroenterology

## 2014-08-09 ENCOUNTER — Encounter (HOSPITAL_COMMUNITY)
Admission: RE | Disposition: A | Discharge status: Home / Self Care | Payer: Self-pay | Source: Ambulatory Visit | Attending: Gastroenterology

## 2014-08-09 ENCOUNTER — Encounter (HOSPITAL_COMMUNITY): Payer: Self-pay | Admitting: *Deleted

## 2014-08-09 DIAGNOSIS — Z881 Allergy status to other antibiotic agents status: Secondary | ICD-10-CM | POA: Diagnosis not present

## 2014-08-09 DIAGNOSIS — K625 Hemorrhage of anus and rectum: Secondary | ICD-10-CM | POA: Diagnosis present

## 2014-08-09 DIAGNOSIS — Z7982 Long term (current) use of aspirin: Secondary | ICD-10-CM | POA: Diagnosis not present

## 2014-08-09 DIAGNOSIS — Z9089 Acquired absence of other organs: Secondary | ICD-10-CM | POA: Insufficient documentation

## 2014-08-09 DIAGNOSIS — I10 Essential (primary) hypertension: Secondary | ICD-10-CM | POA: Insufficient documentation

## 2014-08-09 DIAGNOSIS — Z853 Personal history of malignant neoplasm of breast: Secondary | ICD-10-CM | POA: Insufficient documentation

## 2014-08-09 DIAGNOSIS — Z90710 Acquired absence of both cervix and uterus: Secondary | ICD-10-CM | POA: Diagnosis not present

## 2014-08-09 DIAGNOSIS — K6389 Other specified diseases of intestine: Secondary | ICD-10-CM | POA: Diagnosis not present

## 2014-08-09 DIAGNOSIS — E119 Type 2 diabetes mellitus without complications: Secondary | ICD-10-CM | POA: Diagnosis not present

## 2014-08-09 DIAGNOSIS — E785 Hyperlipidemia, unspecified: Secondary | ICD-10-CM | POA: Insufficient documentation

## 2014-08-09 DIAGNOSIS — R194 Change in bowel habit: Secondary | ICD-10-CM

## 2014-08-09 DIAGNOSIS — Z79899 Other long term (current) drug therapy: Secondary | ICD-10-CM | POA: Diagnosis not present

## 2014-08-09 DIAGNOSIS — K648 Other hemorrhoids: Secondary | ICD-10-CM | POA: Insufficient documentation

## 2014-08-09 DIAGNOSIS — K5730 Diverticulosis of large intestine without perforation or abscess without bleeding: Secondary | ICD-10-CM | POA: Diagnosis not present

## 2014-08-09 HISTORY — PX: COLONOSCOPY: SHX5424

## 2014-08-09 LAB — GLUCOSE, CAPILLARY
Glucose-Capillary: 53 mg/dL — ABNORMAL LOW (ref 70–99)
Glucose-Capillary: 107 mg/dL — ABNORMAL HIGH (ref 70–99)

## 2014-08-09 SURGERY — COLONOSCOPY
Anesthesia: Moderate Sedation

## 2014-08-09 MED ORDER — STERILE WATER FOR IRRIGATION IR SOLN
Status: DC | PRN
  Administered 2014-08-09: 11:00:00

## 2014-08-09 MED ORDER — MIDAZOLAM HCL 5 MG/5ML IJ SOLN
INTRAMUSCULAR | Status: DC | PRN
  Administered 2014-08-09: 2 mg via INTRAVENOUS
  Administered 2014-08-09: 1 mg via INTRAVENOUS
  Administered 2014-08-09: 2 mg via INTRAVENOUS

## 2014-08-09 MED ORDER — MEPERIDINE HCL 100 MG/ML IJ SOLN
INTRAMUSCULAR | Status: DC | PRN
  Administered 2014-08-09 (×3): 25 mg via INTRAVENOUS

## 2014-08-09 MED ORDER — MIDAZOLAM HCL 5 MG/5ML IJ SOLN
INTRAMUSCULAR | Status: AC
  Filled 2014-08-09: qty 10

## 2014-08-09 MED ORDER — MEPERIDINE HCL 100 MG/ML IJ SOLN
INTRAMUSCULAR | Status: AC
  Filled 2014-08-09: qty 2

## 2014-08-09 MED ORDER — DEXTROSE IN LACTATED RINGERS 5 % IV SOLN
INTRAVENOUS | Status: DC
  Administered 2014-08-09: 10:00:00 via INTRAVENOUS

## 2014-08-09 MED ORDER — SODIUM CHLORIDE 0.9 % IV SOLN
INTRAVENOUS | Status: DC
  Administered 2014-08-09: 10:00:00 via INTRAVENOUS

## 2014-08-09 NOTE — H&P (View-Only) (Signed)
Subjective:    Patient ID: [name], female    DOB: 01/19/1936, 78 y.o.   MRN: [id number]  DAVIDSON,ERIC, MD  HPI BEFORE 6 weeks ago she was regular. Seeing blood in stoOl q1-2 weeks for past 6 weeks. NO ABDOMINAL PAIN JUST BLOATING. NO WEIGHT OR APPETITE LOSS. HAD STOOL TEST ~ 6 WEEKS AGO. NO NEW MEDS. BEEN DIABETIC FOR > 10 YEARS.  PT DENIES FEVER, CHILLS, HEMATEMESIS, nausea, vomiting, melena, diarrhea, CHEST PAIN, SHORTNESS OF BREATH,  abdominal pain, problems swallowing, problems with sedation, OR heartburn or indigestion.   Past Medical History  Diagnosis Date  . Hypertension   . Hyperlipidemia   . Breast calcifications     right  . Pneumonia     at age 80    . Glaucoma   . Glaucoma   . Cancer of upper-outer quadrant of female breast 09/24/2011    surg -right  . Diabetes mellitus without complication    Past Surgical History  Procedure Laterality Date  . Appendectomy  1954  . Abdominal hysterectomy  1983  . Rotator cuff repair  2010    left  . Breast biopsy  1980    right breast  . Breast lumpectomy w/ needle localization  09/24/2011    Right Dr Margot Chimes  . Breast cyst excision  10/14/2011    Procedure: CYST EXCISION BREAST;  Surgeon: Haywood Lasso, MD;  Location: [facility];  Service: General;  Laterality: Right;  Re-excision of right breast cancer  . Orif patella  03/04/2012    Procedure: OPEN REDUCTION INTERNAL (ORIF) FIXATION PATELLA;  Surgeon: Carole Civil, MD;  Location: [facility];  Service: Orthopedics;  Laterality: Right;  . Tonsillectomy      as young adult  . Hardware removal Right 05/14/2013    Procedure: HARDWARE REMOVAL;  Surgeon: Carole Civil, MD;  Location: [facility];  Service: Orthopedics;  Laterality: Right;   Allergies  Allergen Reactions  . Amoxicillin Other (See Comments)    Patient does not recall reaction.    Current Outpatient Prescriptions  Medication Sig Dispense Refill  . anastrozole (ARIMIDEX) 1 MG tablet  Take 1 mg by mouth daily.      Marland Kitchen aspirin (BAYER ASPIRIN) 325 MG tablet Take 325 mg by mouth daily.       . bimatoprost (LUMIGAN) 0.01 % SOLN Place 1 drop into both eyes daily.      . calcium carbonate 200 MG capsule Take 200 mg by mouth 2 (two) times daily with a meal.       . Cholecalciferol (VITAMIN D-3 PO) Take 1,000 Units by mouth daily.      . Cyanocobalamin (B-12 PO) Take 500 mg by mouth daily.      . enalapril (VASOTEC) 10 MG tablet Take 10 mg by mouth daily.        . folic acid (FOLVITE) 329 MCG tablet Take 400 mcg by mouth daily.      . Ginkgo Biloba (GINKOBA PO) Take 120 mg by mouth daily.      . Omega-3 Fatty Acids (FISH OIL) 1000 MG CAPS Take 1 capsule by mouth daily.       . pioglitazone (ACTOS) 45 MG tablet Take 45 mg by mouth daily.        . potassium chloride SA (K-DUR,KLOR-CON) 20 MEQ tablet Take 20 mEq by mouth 2 (two) times daily.      . rosuvastatin (CRESTOR) 40 MG tablet Take 40 mg by mouth daily.        Marland Kitchen  timolol (TIMOPTIC) 0.5 % ophthalmic solution Place 1 drop into both eyes daily.      Marland Kitchen triamterene-hydrochlorothiazide (DYAZIDE) 37.5-25 MG per capsule Take 1 capsule by mouth every morning.        . vitamin C (ASCORBIC ACID) 500 MG tablet Take 500 mg by mouth daily.      . vitamin E 400 UNIT capsule Take 400 Units by mouth daily.       Family History  Problem Relation Age of Onset  . Stroke Mother   . Heart disease Father   . Breast cancer Sister   . Colon cancer Neg Hx   . Colon polyps Neg Hx      History  Substance Use Topics  . Smoking status: Never Smoker   . Smokeless tobacco: Never Used  . Alcohol Use: No      Review of Systems PER HPI OTHERWISE ALL SYSTEMS ARE NEGATIVE.     Objective:   Physical Exam  Vitals reviewed. Constitutional: She is oriented to person, place, and time. She appears well-developed and well-nourished. No distress.  HENT:  Head: Normocephalic and atraumatic.  Mouth/Throat: Oropharynx is clear and moist. No  oropharyngeal exudate.  Eyes: Pupils are equal, round, and reactive to light. No scleral icterus.  Neck: Normal range of motion. Neck supple.  Cardiovascular: Normal rate, regular rhythm and normal heart sounds.   Pulmonary/Chest: Effort normal and breath sounds normal. No respiratory distress.  Abdominal: Soft. Bowel sounds are normal. She exhibits no distension. There is no tenderness.  Musculoskeletal: She exhibits no edema.  Lymphadenopathy:    She has no cervical adenopathy.  Neurological: She is alert and oriented to person, place, and time.  Psychiatric: She has a normal mood and affect.          Assessment & Plan:

## 2014-08-09 NOTE — Interval H&P Note (Signed)
History and Physical Interval Note:  08/09/2014 9:28 AM  [name]  has presented today for surgery, with the diagnosis of BRBPR, CHANGE IN BOWEL HABITS  The various methods of treatment have been discussed with the patient and family. After consideration of risks, benefits and other options for treatment, the patient has consented to  Procedure(s) with comments: COLONOSCOPY (N/A) - 10:15 as a surgical intervention .  The patient's history has been reviewed, patient examined, no change in status, stable for surgery.  I have reviewed the patient's chart and labs.  Questions were answered to the patient's satisfaction.     [employer]

## 2014-08-09 NOTE — Discharge Instructions (Signed)
YOUR RECTAL BLEEDING IS DUE TO internal hemorrhoids. YOU HAVE DIVERTICULOSIS IN YOUR LEFT COLON. YOUR SMALL BOWEL IS NORMAL.   DRINK WATER TO KEEP URINE YELLOW  FOLLOW A HIGH FIBER/LOW FAT DIET. AVOID ITEMS THAT CAUSE BLOATING. SEE INFO BELOW.  ADD COLACE one pill TWICE DAILY.  YOUR BIOPSY RESULTS WILL BE AVAILABLE IN 14 DAYS.  FOLLOW UP IN NOV 2015.  Next colonoscopy in 10-15 years if the benefits outweigh the risks.  ENDOSCOPY Care After Read the instructions outlined below and refer to this sheet in the next week. These discharge instructions provide you with general information on caring for yourself after you leave the hospital. While your treatment has been planned according to the most current medical practices available, unavoidable complications occasionally occur. If you have any problems or questions after discharge, call DR. Otelia Hettinger, [phone].  ACTIVITY  You may resume your regular activity, but move at a slower pace for the next 24 hours.   Take frequent rest periods for the next 24 hours.   Walking will help get rid of the air and reduce the bloated feeling in your belly (abdomen).   No driving for 24 hours (because of the medicine (anesthesia) used during the test).   You may shower.   Do not sign any important legal documents or operate any machinery for 24 hours (because of the anesthesia used during the test).    NUTRITION  Drink plenty of fluids.   You may resume your normal diet as instructed by your doctor.   Begin with a light meal and progress to your normal diet. Heavy or fried foods are harder to digest and may make you feel sick to your stomach (nauseated).   Avoid alcoholic beverages for 24 hours or as instructed.    MEDICATIONS  You may resume your normal medications.   WHAT YOU CAN EXPECT TODAY  Some feelings of bloating in the abdomen.   Passage of more gas than usual.   Spotting of blood in your stool or on the toilet  paper  .  IF YOU HAD POLYPS REMOVED DURING THE ENDOSCOPY:  Eat a soft diet IF YOU HAVE NAUSEA, BLOATING, ABDOMINAL PAIN, OR VOMITING.    FINDING OUT THE RESULTS OF YOUR TEST Not all test results are available during your visit. DR. Oneida Alar WILL CALL YOU WITHIN 7 DAYS OF YOUR PROCEDUE WITH YOUR RESULTS. Do not assume everything is normal if you have not heard from DR. Shell Yandow IN ONE WEEK, CALL HER OFFICE AT [phone].  SEEK IMMEDIATE MEDICAL ATTENTION AND CALL THE OFFICE: [phone] IF:  You have more than a spotting of blood in your stool.   Your belly is swollen (abdominal distention).   You are nauseated or vomiting.   You have a temperature over 101F.   You have abdominal pain or discomfort that is severe or gets worse throughout the day.   Constipation in Adults Constipation is having fewer than 2 bowel movements per week. Usually, the stools are hard. As we grow older, constipation is more common. If you try to fix constipation with laxatives, the problem may get worse. This is because laxatives taken over a long period of time make the colon muscles weaker. A low-fiber diet, not taking in enough fluids, and taking some medicines may make these problems worse.  HOME CARE INSTRUCTIONS  Constipation is usually best cared for without medicines. Increasing dietary fiber and eating more fruits and vegetables is the best way to manage constipation.   Slowly  increase fiber intake to 25 to 38 grams per day. Whole grains, fruits, vegetables, and legumes are good sources of fiber. A dietitian can further help you incorporate high-fiber foods into your diet.   Drink enough water and fluids to keep your urine clear or pale yellow.   A fiber supplement may be added to your diet if you cannot get enough fiber from foods.   Increasing your activities also helps improve regularity.   Stronger measures, such as magnesium sulfate, should be avoided if possible. This may cause  uncontrollable diarrhea. Using magnesium sulfate may not allow you time to make it to the bathroom.     High-Fiber Diet A high-fiber diet changes your normal diet to include more whole grains, legumes, fruits, and vegetables. Changes in the diet involve replacing refined carbohydrates with unrefined foods. The calorie level of the diet is essentially unchanged. The Dietary Reference Intake (recommended amount) for adult males is 38 grams per day. For adult females, it is 25 grams per day. Pregnant and lactating women should consume 28 grams of fiber per day. Fiber is the intact part of a plant that is not broken down during digestion. Functional fiber is fiber that has been isolated from the plant to provide a beneficial effect in the body. PURPOSE  Increase stool bulk.   Ease and regulate bowel movements.   Lower cholesterol.  INDICATIONS THAT YOU NEED MORE FIBER  Constipation and hemorrhoids.   Uncomplicated diverticulosis (intestine condition) and irritable bowel syndrome.   Weight management.   As a protective measure against hardening of the arteries (atherosclerosis), diabetes, and cancer.   GUIDELINES FOR INCREASING FIBER IN THE DIET  Start adding fiber to the diet slowly. A gradual increase of about 5 more grams (2 slices of whole-wheat bread, 2 servings of most fruits or vegetables, or 1 bowl of high-fiber cereal) per day is best. Too rapid an increase in fiber may result in constipation, flatulence, and bloating.   Drink enough water and fluids to keep your urine clear or pale yellow. Water, juice, or caffeine-free drinks are recommended. Not drinking enough fluid may cause constipation.   Eat a variety of high-fiber foods rather than one type of fiber.   Try to increase your intake of fiber through using high-fiber foods rather than fiber pills or supplements that contain small amounts of fiber.   The goal is to change the types of food eaten. Do not supplement your  present diet with high-fiber foods, but replace foods in your present diet.  INCLUDE A VARIETY OF FIBER SOURCES  Replace refined and processed grains with whole grains, canned fruits with fresh fruits, and incorporate other fiber sources. White rice, white breads, and most bakery goods contain little or no fiber.   Brown whole-grain rice, buckwheat oats, and many fruits and vegetables are all good sources of fiber. These include: broccoli, Brussels sprouts, cabbage, cauliflower, beets, sweet potatoes, white potatoes (skin on), carrots, tomatoes, eggplant, squash, berries, fresh fruits, and dried fruits.   Cereals appear to be the richest source of fiber. Cereal fiber is found in whole grains and bran. Bran is the fiber-rich outer coat of cereal grain, which is largely removed in refining. In whole-grain cereals, the bran remains. In breakfast cereals, the largest amount of fiber is found in those with "bran" in their names. The fiber content is sometimes indicated on the label.   You may need to include additional fruits and vegetables each day.   In baking, for 1  cup white flour, you may use the following substitutions:   1 cup whole-wheat flour minus 2 tablespoons.   1/2 cup white flour plus 1/2 cup whole-wheat flour.   Low-Fat Diet BREADS, CEREALS, PASTA, RICE, DRIED PEAS, AND BEANS These products are high in carbohydrates and most are low in fat. Therefore, they can be increased in the diet as substitutes for fatty foods. They too, however, contain calories and should not be eaten in excess. Cereals can be eaten for snacks as well as for breakfast.  Include foods that contain fiber (fruits, vegetables, whole grains, and legumes). Research shows that fiber may lower blood cholesterol levels, especially the water-soluble fiber found in fruits, vegetables, oat products, and legumes. FRUITS AND VEGETABLES It is good to eat fruits and vegetables. Besides being sources of fiber, both are rich in  vitamins and some minerals. They help you get the daily allowances of these nutrients. Fruits and vegetables can be used for snacks and desserts. MEATS Limit lean meat, chicken, [place], and fish to no more than 6 ounces per day. Beef, Pork, and Lamb Use lean cuts of beef, pork, and lamb. Lean cuts include:  Extra-lean ground beef.  Arm roast.  Sirloin tip.  Center-cut ham.  Round steak.  Loin chops.  Rump roast.  Tenderloin.  Trim all fat off the outside of meats before cooking. It is not necessary to severely decrease the intake of red meat, but lean choices should be made. Lean meat is rich in protein and contains a highly absorbable form of iron. Premenopausal women, in particular, should avoid reducing lean red meat because this could increase the risk for low red blood cells (iron-deficiency anemia). The organ meats, such as liver, sweetbreads, kidneys, and brain are very rich in cholesterol. They should be limited. Chicken and [place] These are good sources of protein. The fat of poultry can be reduced by removing the skin and underlying fat layers before cooking. Chicken and [place] can be substituted for lean red meat in the diet. Poultry should not be fried or covered with high-fat sauces. Fish and Shellfish Fish is a good source of protein. Shellfish contain cholesterol, but they usually are low in saturated fatty acids. The preparation of fish is important. Like chicken and [place], they should not be fried or covered with high-fat sauces. EGGS Egg whites contain no fat or cholesterol. They can be eaten often. Try 1 to 2 egg whites instead of whole eggs in recipes or use egg substitutes that do not contain yolk. MILK AND DAIRY PRODUCTS Use skim or 1% milk instead of 2% or whole milk. Decrease whole milk, natural, and processed cheeses. Use nonfat or low-fat (2%) cottage cheese or low-fat cheeses made from vegetable oils. Choose nonfat or low-fat (1 to 2%) yogurt. Experiment with  evaporated skim milk in recipes that call for heavy cream. Substitute low-fat yogurt or low-fat cottage cheese for sour cream in dips and salad dressings. Have at least 2 servings of low-fat dairy products, such as 2 glasses of skim (or 1%) milk each day to help get your daily calcium intake.  FATS AND OILS Reduce the total intake of fats, especially saturated fat. Butterfat, lard, and beef fats are high in saturated fat and cholesterol. These should be avoided as much as possible. Vegetable fats do not contain cholesterol, but certain vegetable fats, such as coconut oil, palm oil, and palm kernel oil are very high in saturated fats. These should be limited. These fats are often used in  bakery goods, processed foods, popcorn, oils, and nondairy creamers. Vegetable shortenings and some peanut butters contain hydrogenated oils, which are also saturated fats. Read the labels on these foods and check for saturated vegetable oils. Unsaturated vegetable oils and fats do not raise blood cholesterol. However, they should be limited because they are fats and are high in calories. Total fat should still be limited to 30% of your daily caloric intake. Desirable liquid vegetable oils are corn oil, cottonseed oil, olive oil, canola oil, safflower oil, soybean oil, and sunflower oil. Peanut oil is not as good, but small amounts are acceptable. Buy a heart-healthy tub margarine that has no partially hydrogenated oils in the ingredients. Mayonnaise and salad dressings often are made from unsaturated fats, but they should also be limited because of their high calorie and fat content. Seeds, nuts, peanut butter, olives, and avocados are high in fat, but the fat is mainly the unsaturated type. These foods should be limited mainly to avoid excess calories and fat. OTHER EATING TIPS Snacks  Most sweets should be limited as snacks. They tend to be rich in calories and fats, and their caloric content outweighs their nutritional  value. Some good choices in snacks are graham crackers, melba toast, soda crackers, bagels (no egg), English muffins, fruits, and vegetables. These snacks are preferable to snack crackers, [place] fries, and chips. Popcorn should be air-popped or cooked in small amounts of liquid vegetable oil. Desserts Eat fruit, low-fat yogurt, and fruit ices. AVOID pastries, cake, and cookies. Sherbet, angel food cake, gelatin dessert, frozen low-fat yogurt, or other frozen products that do not contain saturated fat (pure fruit juice bars, frozen ice pops) are also acceptable.  COOKING METHODS Choose those methods that use little or no fat. They include: Poaching.  Braising.  Steaming.  Grilling.  Baking.  Stir-frying.  Broiling.  Microwaving.  Foods can be cooked in a nonstick pan without added fat, or use a nonfat cooking spray in regular cookware. Limit fried foods and avoid frying in saturated fat. Add moisture to lean meats by using water, broth, cooking wines, and other nonfat or low-fat sauces along with the cooking methods mentioned above. Soups and stews should be chilled after cooking. The fat that forms on top after a few hours in the refrigerator should be skimmed off. When preparing meals, avoid using excess salt. Salt can contribute to raising blood pressure in some people. EATING AWAY FROM HOME Order entres, potatoes, and vegetables without sauces or butter. When meat exceeds the size of a deck of cards (3 to 4 ounces), the rest can be taken home for another meal. Choose vegetable or fruit salads and ask for low-calorie salad dressings to be served on the side. Use dressings sparingly. Limit high-fat toppings, such as bacon, crumbled eggs, cheese, sunflower seeds, and olives. Ask for heart-healthy tub margarine instead of butter.   Hemorrhoids Hemorrhoids are dilated (enlarged) veins around the rectum. Sometimes clots will form in the veins. This makes them swollen and painful. These are called  thrombosed hemorrhoids. Causes of hemorrhoids include:  Constipation.   Straining to have a bowel movement.   HEAVY LIFTING  HOME CARE INSTRUCTIONS  Eat a well balanced diet and drink 6 to 8 glasses of water every day to avoid constipation. You may also use a bulk laxative.   Avoid straining to have bowel movements.   Keep anal area dry and clean.   Do not use a donut shaped pillow or sit on the toilet for long  periods. This increases blood pooling and pain.   Move your bowels when your body has the urge; this will require less straining and will decrease pain and pressure.

## 2014-08-10 NOTE — Op Note (Signed)
[facility] [address] [place], [address]   COLONOSCOPY PROCEDURE REPORT  PATIENT: Brandi, Johnston  MR#: [id number] BIRTHDATE: 1936-09-26 , 78  yrs. old GENDER: Female ENDOSCOPIST: Barney Drain, MD REFERRED BY: PROCEDURE DATE:  08/09/2014 PROCEDURE:   Colonoscopy, diagnostic INDICATIONS:Rectal Bleeding. MEDICATIONS: Demerol 75 mg IV and Versed 5 mg IV  DESCRIPTION OF PROCEDURE:    Physical exam was performed.  Informed consent was obtained from the patient after explaining the benefits, risks, and alternatives to procedure.  The patient was connected to monitor and placed in left lateral position. Continuous oxygen was provided by nasal cannula and IV medicine administered through an indwelling cannula.  After administration of sedation and rectal exam, the patients rectum was intubated and the EC-[id number] ([id number])  colonoscope was advanced under direct visualization to the ileum.  The scope was removed slowly by carefully examining the color, texture, anatomy, and integrity mucosa on the way out.  The patient was recovered in endoscopy and discharged home in satisfactory condition.    COLON FINDINGS: The mucosa appeared normal in the terminal ileum.  , There was moderate diverticulosis noted in the sigmoid colon with associated muscular hypertrophy.  otherwise slightly redundant colon. Small internal hemorrhoids were found.  PREP QUALITY: excellent.  CECAL W/D TIME: 13 minutes COMPLICATIONS: None  ENDOSCOPIC IMPRESSION: 1.   Normal mucosa in the terminal ileum 2.   Moderate diverticulosis in the sigmoid colon 3.   rectal bleeding due to Small internal hemorrhoids   RECOMMENDATIONS: DRINK WATER TO KEEP URINE YELLOW FOLLOW A HIGH FIBER/LOW FAT DIET.  AVOID ITEMS THAT CAUSE BLOATING.  ADD COLACE one pill TWICE DAILY. BIOPSY RESULTS WILL BE AVAILABLE IN 14 DAYS. FOLLOW UP IN NOV 2015.  Next colonoscopy in 10-15 years if the benefits outweigh the  risks.       _______________________________ Lorrin MaisBarney Drain, MD 08/10/2014 12:03 PM

## 2014-08-16 ENCOUNTER — Encounter (HOSPITAL_COMMUNITY): Payer: Self-pay | Admitting: Gastroenterology

## 2014-08-18 ENCOUNTER — Encounter: Payer: Self-pay | Admitting: Gastroenterology

## 2014-09-07 ENCOUNTER — Other Ambulatory Visit: Payer: Self-pay | Admitting: Gastroenterology

## 2014-11-15 ENCOUNTER — Ambulatory Visit: Payer: Medicare Other

## 2014-11-15 ENCOUNTER — Ambulatory Visit (INDEPENDENT_AMBULATORY_CARE_PROVIDER_SITE_OTHER): Payer: Medicare Other | Admitting: Orthopedic Surgery

## 2014-11-15 ENCOUNTER — Ambulatory Visit (INDEPENDENT_AMBULATORY_CARE_PROVIDER_SITE_OTHER): Payer: Medicare Other

## 2014-11-15 ENCOUNTER — Encounter: Payer: Self-pay | Admitting: Orthopedic Surgery

## 2014-11-15 VITALS — BP 112/62 | Ht 62.0 in | Wt 194.0 lb

## 2014-11-15 DIAGNOSIS — M25551 Pain in right hip: Secondary | ICD-10-CM

## 2014-11-15 DIAGNOSIS — M5442 Lumbago with sciatica, left side: Secondary | ICD-10-CM

## 2014-11-15 DIAGNOSIS — M4716 Other spondylosis with myelopathy, lumbar region: Secondary | ICD-10-CM

## 2014-11-15 DIAGNOSIS — M545 Low back pain, unspecified: Secondary | ICD-10-CM | POA: Insufficient documentation

## 2014-11-15 DIAGNOSIS — M4726 Other spondylosis with radiculopathy, lumbar region: Secondary | ICD-10-CM

## 2014-11-15 DIAGNOSIS — M5441 Lumbago with sciatica, right side: Secondary | ICD-10-CM

## 2014-11-15 DIAGNOSIS — M25552 Pain in left hip: Secondary | ICD-10-CM

## 2014-11-15 DIAGNOSIS — M47812 Spondylosis without myelopathy or radiculopathy, cervical region: Secondary | ICD-10-CM | POA: Insufficient documentation

## 2014-11-15 MED ORDER — DICLOFENAC POTASSIUM 50 MG PO TABS: 50.0000 mg | ORAL_TABLET | Freq: Two times a day (BID) | ORAL | Status: AC

## 2014-11-15 MED ORDER — GABAPENTIN 100 MG PO CAPS: 100.0000 mg | ORAL_CAPSULE | Freq: Three times a day (TID) | ORAL | Status: AC

## 2014-11-15 NOTE — Progress Notes (Signed)
Patient ID: [name], female   DOB: [birth year], 78 y.o.   MRN: [id number] Patient ID: [name], female   DOB: Sep 24, 1936, 78 y.o.   MRN: [id number]  Chief Complaint  Patient presents with  . Hip Pain    left hip pain    HPI [name] is a 78 y.o. female.  HPI Comments: This very pleasant 78 year old female complains of left hip pain for 3-4 weeks as well as left and right shoulder discomfort. She reports no trauma but reports stiffness and stabbing pain radiating down the left leg starting in the lower back. She rates her pain 5 out of 10. She tried Vicks VapoRub without relief.  She has no history of prior hip or back problems. Denies groin pain.  She reports her review of systems is occasional constipation stiff joints and lower back pain although the systems reviewed were normal     Past Medical History  Diagnosis Date  . Hypertension   . Hyperlipidemia   . Breast calcifications     right  . Pneumonia     at age 69    . Glaucoma   . Glaucoma   . Cancer of upper-outer quadrant of female breast 09/24/2011    surg -right  . Diabetes mellitus without complication     Past Surgical History  Procedure Laterality Date  . Appendectomy  1954  . Abdominal hysterectomy  1983  . Rotator cuff repair  2010    left  . Breast biopsy  1980    right breast  . Breast lumpectomy w/ needle localization  09/24/2011    Right Dr Margot Chimes  . Breast cyst excision  10/14/2011    Procedure: CYST EXCISION BREAST;  Surgeon: Haywood Lasso, MD;  Location: [facility];  Service: General;  Laterality: Right;  Re-excision of right breast cancer  . Orif patella  03/04/2012    Procedure: OPEN REDUCTION INTERNAL (ORIF) FIXATION PATELLA;  Surgeon: Carole Civil, MD;  [facility];  Service: Orthopedics;  Laterality: Right;  . Tonsillectomy      as young adult  . Hardware removal Right 05/14/2013    Procedure: HARDWARE REMOVAL;  Surgeon: Carole Civil, MD;  Location:  [facility];  Service: Orthopedics;  Laterality: Right;  . Colonoscopy N/A 08/09/2014    Procedure: COLONOSCOPY;  Surgeon: Danie Binder, MD;  [facility];  Service: Endoscopy;  Laterality: N/A;  10:15    Family History  Problem Relation Age of Onset  . Stroke Mother   . Heart disease Father   . Breast cancer Sister   . Colon cancer Neg Hx   . Colon polyps Neg Hx     Social History History  Substance Use Topics  . Smoking status: Never Smoker   . Smokeless tobacco: Never Used  . Alcohol Use: No    Allergies  Allergen Reactions  . Amoxicillin Other (See Comments)    Patient does not recall reaction.    Current Outpatient Prescriptions  Medication Sig Dispense Refill  . anastrozole (ARIMIDEX) 1 MG tablet Take 1 mg by mouth daily.    Marland Kitchen aspirin (BAYER ASPIRIN) 325 MG tablet Take 325 mg by mouth daily.     . bimatoprost (LUMIGAN) 0.01 % SOLN Place 1 drop into both eyes daily.    . calcium carbonate 200 MG capsule Take 200 mg by mouth 2 (two) times daily with a meal.     . Cholecalciferol (VITAMIN D-3 PO) Take  1,000 Units by mouth daily.    . Cyanocobalamin (B-12 PO) Take 500 mg by mouth daily.    . enalapril (VASOTEC) 10 MG tablet Take 10 mg by mouth daily.      . folic acid (FOLVITE) 628 MCG tablet Take 400 mcg by mouth daily.    . Ginkgo Biloba (GINKOBA PO) Take 120 mg by mouth daily.    . Omega-3 Fatty Acids (FISH OIL) 1000 MG CAPS Take 1 capsule by mouth daily.     . pioglitazone (ACTOS) 45 MG tablet Take 45 mg by mouth daily.      . potassium chloride SA (K-DUR,KLOR-CON) 20 MEQ tablet Take 20 mEq by mouth 2 (two) times daily.    . rosuvastatin (CRESTOR) 40 MG tablet Take 40 mg by mouth daily.      . timolol (TIMOPTIC) 0.5 % ophthalmic solution Place 1 drop into both eyes daily.    Marland Kitchen triamterene-hydrochlorothiazide (DYAZIDE) 37.5-25 MG per capsule Take 1 capsule by mouth every morning.      . vitamin C (ASCORBIC ACID) 500 MG tablet Take 500 mg by mouth daily.    .  vitamin E 400 UNIT capsule Take 400 Units by mouth daily.     No current facility-administered medications for this visit.    Review of Systems Review of Systems  Blood pressure 112/62, height 5\' 2"  (1.575 m), weight 194 lb (87.998 kg).  Physical Exam Physical Exam  Musculoskeletal:       Right shoulder: Normal.       Left shoulder: Normal.       Right hip: Normal.       Left hip: Normal.   BP 112/62 mmHg  Ht 5\' 2"  (1.575 m)  Wt 194 lb (87.998 kg)  BMI 35.47 kg/m2  The patient is awake alert and oriented 3 mood and affect are normal. She is well-groomed well-nourished well-developed no developmental abnormalities. She is noticeably limping favoring her left side.  Left Hip Exam  Left hip exam is normal.  Right Hip Exam  Right hip exam is normal.  Back Exam  Sensation: Normal. Gait: Abnormal.  Tenderness  The patient is experiencing tenderness in the lumbar. SLR   Right: Negative Left:    Negative  Muscle Strength  Hip Abductors:  5/5 Hip Adductors:  5/5 Quadriceps:      5/5 Hamstrings:      0/5  Tests  Toe Walk: Normal Heel Walk: Normal  Reflexes  Patellar:  2/4 Achilles:  1/4 Babinski: Normal  Left Shoulder Exam  Left shoulder exam is normal.  Right Shoulder Exam  Right shoulder exam is normal.    C-spine exam her tenderness is in the cervical spine midline with decreased range of motion crepitance.  Negative Spurling   Data Reviewed X-rays were ordered in the office I interpret the echo is normal pelvis including hip joints, more spondylosis with scoliosis., Spondylolisthesis also noted.  Assessment    Encounter Diagnoses  Name Primary?  . Right hip pain   . Left hip pain   . Bilateral low back pain with sciatica, sciatica laterality unspecified   . Osteoarthritis of spine with radiculopathy, lumbar region Yes  . Cervical spondylosis without myelopathy   . Lumbar spondylosis with myelopathy        Plan    We advise physical  therapy for the neck and lumbar spine     Meds ordered this encounter  Medications  . gabapentin (NEURONTIN) 100 MG capsule    Sig: Take  1 capsule (100 mg total) by mouth 3 (three) times daily.    Dispense:  90 capsule    Refill:  2  . diclofenac (CATAFLAM) 50 MG tablet    Sig: Take 1 tablet (50 mg total) by mouth 2 (two) times daily.    Dispense:  60 tablet    Refill:  2   Orders Placed This Encounter  Procedures  . DG Hip Complete Left    Standing Status: Future     Number of Occurrences:      Standing Expiration Date: 01/17/2016    Order Specific Question:  Reason for Exam (SYMPTOM  OR DIAGNOSIS REQUIRED)    Answer:  left hip pain    Order Specific Question:  Preferred imaging location?    Answer:  External  . DG Lumbar Spine 2-3 Views    Standing Status: Future     Number of Occurrences: 1     Standing Expiration Date: 01/17/2016    Order Specific Question:  Reason for Exam (SYMPTOM  OR DIAGNOSIS REQUIRED)    Answer:  back pain    Order Specific Question:  Preferred imaging location?    Answer:  External  . DG Pelvis 1-2 Views    Standing Status: Future     Number of Occurrences: 1     Standing Expiration Date: 01/17/2016    Order Specific Question:  Reason for Exam (SYMPTOM  OR DIAGNOSIS REQUIRED)    Answer:  hip pain    Order Specific Question:  Preferred imaging location?    Answer:  External  . Ambulatory referral to Physical Therapy    Referral Priority:  Routine    Referral Type:  Physical Medicine    Referral Reason:  Specialty Services Required    Referred to Provider:  [facility]    Requested Specialty:  Physical Therapy    Number of Visits Requested:  1    Arther Abbott 11/15/2014, 10:30 AM

## 2014-11-15 NOTE — Patient Instructions (Signed)
Call to arrange therapy at [facility]

## 2014-12-27 ENCOUNTER — Encounter: Payer: Self-pay | Admitting: Orthopedic Surgery

## 2014-12-27 ENCOUNTER — Ambulatory Visit: Payer: Medicare Other | Admitting: Orthopedic Surgery

## 2015-01-17 ENCOUNTER — Ambulatory Visit (INDEPENDENT_AMBULATORY_CARE_PROVIDER_SITE_OTHER): Payer: BLUE CROSS/BLUE SHIELD | Admitting: Orthopedic Surgery

## 2015-01-17 ENCOUNTER — Encounter: Payer: Self-pay | Admitting: Orthopedic Surgery

## 2015-01-17 VITALS — BP 121/63 | Ht 62.0 in | Wt 194.0 lb

## 2015-01-17 DIAGNOSIS — M5442 Lumbago with sciatica, left side: Secondary | ICD-10-CM

## 2015-01-17 DIAGNOSIS — M1711 Unilateral primary osteoarthritis, right knee: Secondary | ICD-10-CM

## 2015-01-17 DIAGNOSIS — M7651 Patellar tendinitis, right knee: Secondary | ICD-10-CM

## 2015-01-17 DIAGNOSIS — M5441 Lumbago with sciatica, right side: Secondary | ICD-10-CM | POA: Diagnosis not present

## 2015-01-17 DIAGNOSIS — M47812 Spondylosis without myelopathy or radiculopathy, cervical region: Secondary | ICD-10-CM

## 2015-01-17 DIAGNOSIS — S82001D Unspecified fracture of right patella, subsequent encounter for closed fracture with routine healing: Secondary | ICD-10-CM | POA: Diagnosis not present

## 2015-01-17 NOTE — Progress Notes (Signed)
Chief Complaint  Patient presents with  . Follow-up    6 week recheck neck/back s/p therapy   BP 121/63 mmHg  Ht 5\' 2"  (1.575 m)  Wt 194 lb (87.998 kg)  BMI 35.47 kg/m2  Encounter Diagnoses  Name Primary?  . Bilateral low back pain with sciatica, sciatica laterality unspecified   . Patella fracture, right, closed, with routine healing, subsequent encounter   . Primary osteoarthritis of right knee   . Patellar tendinitis, right   . Cervical spondylosis without myelopathy Yes   Follow-up for the above-mentioned diagnoses  The patient is feeling very well. Review of systems negative.  She has no tenderness in the cervical spine and lower back both hips are moving normally both knees are moving normally  She's neurovascularly intact reflexes are 2+ equal and symmetric. Pulses are normal in both the radial and ulnar arteries bilaterally  She is awake alert and oriented 3, mood and affect normal. Appearance well-groomed  Vital signs stable BP 121/63 mmHg  Ht 5\' 2"  (1.575 m)  Wt 194 lb (87.998 kg)  BMI 35.47 kg/m2  All diagnoses listed above stable with no symptoms  Follow-up as needed

## 2017-02-25 ENCOUNTER — Ambulatory Visit (INDEPENDENT_AMBULATORY_CARE_PROVIDER_SITE_OTHER): Payer: Medicare Other

## 2017-02-25 ENCOUNTER — Ambulatory Visit (INDEPENDENT_AMBULATORY_CARE_PROVIDER_SITE_OTHER): Payer: Medicare Other | Admitting: Orthopedic Surgery

## 2017-02-25 ENCOUNTER — Encounter: Payer: Self-pay | Admitting: Orthopedic Surgery

## 2017-02-25 VITALS — BP 171/83 | HR 61 | Wt 187.0 lb

## 2017-02-25 DIAGNOSIS — S63616A Unspecified sprain of right little finger, initial encounter: Secondary | ICD-10-CM | POA: Diagnosis not present

## 2017-02-25 DIAGNOSIS — M79644 Pain in right finger(s): Secondary | ICD-10-CM

## 2017-02-25 NOTE — Patient Instructions (Signed)
Tape x 7 days

## 2017-02-25 NOTE — Progress Notes (Signed)
Patient ID: [name], female   DOB: [birth year], 81 y.o.   MRN: [id number]  New problem established patient  MEDICAL DECISION MAKING:    Data Reviewed Plain films show osteopenia degenerative arthritis but no obvious fracture in the right hand  Assessment Encounter Diagnoses  Name Primary?  . Finger pain, right Yes  . Sprain of right little finger, unspecified site of finger, initial encounter    Plan Splint by taping for 7 days then active range of motion follow-up as needed if any problems continue     Chief Complaint  Patient presents with  . Hand Pain    Rt small finger pain s/p fall last week    HPI [name] is a 81 y.o. female.  She fell and injured her right hand complains of nonradiating dull aching pain in her right small finger right long finger and the dorsum of her right hand for 7 days  Review of Systems Review of Systems  Constitutional: Negative for fever.  Musculoskeletal: Positive for arthralgias.  Skin:       Ecchymosis on the dorsum of the hand  Neurological: Negative for weakness and numbness.   (2 MINIMUM)  Past Medical History:  Diagnosis Date  . Breast calcifications    right  . Cancer of upper-outer quadrant of female breast ([place]) 09/24/2011   surg -right  . Diabetes mellitus without complication ([place])   . Glaucoma   . Glaucoma   . Hyperlipidemia   . Hypertension   . Pneumonia    at age 11      Past Surgical History:  Procedure Laterality Date  . ABDOMINAL HYSTERECTOMY  1983  . APPENDECTOMY  1954  . BREAST BIOPSY  1980   right breast  . BREAST CYST EXCISION  10/14/2011   Procedure: CYST EXCISION BREAST;  Surgeon: Haywood Lasso, MD;  Location: [facility];  Service: General;  Laterality: Right;  Re-excision of right breast cancer  . BREAST LUMPECTOMY W/ NEEDLE LOCALIZATION  09/24/2011   Right Dr Margot Chimes  . COLONOSCOPY N/A 08/09/2014   Procedure: COLONOSCOPY;  Surgeon: Danie Binder, MD;  [facility];  Service: Endoscopy;  Laterality: N/A;  10:15  . HARDWARE REMOVAL Right 05/14/2013   Procedure: HARDWARE REMOVAL;  Surgeon: Carole Civil, MD;  [facility];  Service: Orthopedics;  Laterality: Right;  . ORIF PATELLA  03/04/2012   Procedure: OPEN REDUCTION INTERNAL (ORIF) FIXATION PATELLA;  Surgeon: Carole Civil, MD;  [facility];  Service: Orthopedics;  Laterality: Right;  . ROTATOR CUFF REPAIR  2010   left  . TONSILLECTOMY     as young adult    Social History Social History  Substance Use Topics  . Smoking status: Never Smoker  . Smokeless tobacco: Never Used  . Alcohol use No    Allergies  Allergen Reactions  . Amoxicillin Other (See Comments)    Patient does not recall reaction.    Current Meds  Medication Sig  . anastrozole (ARIMIDEX) 1 MG tablet Take 1 mg by mouth daily.  Marland Kitchen aspirin (BAYER ASPIRIN) 325 MG tablet Take 325 mg by mouth daily.   . bimatoprost (LUMIGAN) 0.01 % SOLN Place 1 drop into both eyes daily.  . calcium carbonate 200 MG capsule Take 200 mg by mouth 2 (two) times daily with a meal.   . Cholecalciferol (VITAMIN D-3 PO) Take 1,000 Units by mouth daily.  . Cyanocobalamin (B-12 PO) Take 500 mg by mouth daily.  Marland Kitchen  diclofenac (CATAFLAM) 50 MG tablet Take 1 tablet (50 mg total) by mouth 2 (two) times daily.  . enalapril (VASOTEC) 10 MG tablet Take 10 mg by mouth daily.    . folic acid (FOLVITE) 383 MCG tablet Take 400 mcg by mouth daily.  Marland Kitchen gabapentin (NEURONTIN) 100 MG capsule Take 1 capsule (100 mg total) by mouth 3 (three) times daily.  . Ginkgo Biloba (GINKOBA PO) Take 120 mg by mouth daily.  . Omega-3 Fatty Acids (FISH OIL) 1000 MG CAPS Take 1 capsule by mouth daily.   . pioglitazone (ACTOS) 45 MG tablet Take 45 mg by mouth daily.    . potassium chloride SA (K-DUR,KLOR-CON) 20 MEQ tablet Take 20 mEq by mouth 2 (two) times daily.  . rosuvastatin (CRESTOR) 40 MG tablet Take 40 mg by mouth daily.    . timolol (TIMOPTIC) 0.5 %  ophthalmic solution Place 1 drop into both eyes daily.  Marland Kitchen triamterene-hydrochlorothiazide (DYAZIDE) 37.5-25 MG per capsule Take 1 capsule by mouth every morning.    . vitamin C (ASCORBIC ACID) 500 MG tablet Take 500 mg by mouth daily.  . vitamin E 400 UNIT capsule Take 400 Units by mouth daily.      Physical Exam Physical Exam 1.BP (!) 171/83   Pulse 61   Wt 187 lb (84.8 kg)   BMI 34.20 kg/m   2. Gen. appearance. The patient is well-developed and well-nourished, grooming and hygiene are normal. There are no gross congenital abnormalities  3. The patient is alert and oriented to person place and time  4. Mood and affect are normal  5. Ambulation Gait is not contributory but it is heel to toe  Examination reveals the following: 6. On inspection we find swelling and tenderness on the dorsum of the hand with tenderness of the metacarpophalangeal joint #4 and 5 as well as metacarpals #4 and 5  7. With the range of motion of  normal except for the small finger in flexion  8. Stability tests were normal    9. Strength tests revealed grade 5 motor strength  10. Skin we find no rash ulceration or erythema  11. Sensation remains intact  12 Vascular system shows no peripheral edema  Alignment of both the fourth and fifth digits of the right hand i.e. ring and small finger was normal   Arther Abbott 02/25/2017, 3:34 PM

## 2018-09-08 ENCOUNTER — Encounter: Payer: Self-pay | Admitting: Gastroenterology

## 2018-11-19 ENCOUNTER — Ambulatory Visit: Payer: Medicare Other | Admitting: Gastroenterology

## 2018-11-26 ENCOUNTER — Ambulatory Visit (HOSPITAL_COMMUNITY)
Admission: RE | Admit: 2018-11-26 | Discharge: 2018-11-26 | Disposition: A | Discharge status: Home / Self Care | Payer: Medicare Other | Source: Ambulatory Visit | Attending: Gastroenterology | Admitting: Gastroenterology

## 2018-11-26 ENCOUNTER — Ambulatory Visit: Payer: Medicare Other | Admitting: Gastroenterology

## 2018-11-26 ENCOUNTER — Encounter: Payer: Self-pay | Admitting: Gastroenterology

## 2018-11-26 ENCOUNTER — Encounter: Payer: Self-pay | Admitting: *Deleted

## 2018-11-26 DIAGNOSIS — R1310 Dysphagia, unspecified: Secondary | ICD-10-CM | POA: Diagnosis not present

## 2018-11-26 DIAGNOSIS — R059 Cough, unspecified: Secondary | ICD-10-CM

## 2018-11-26 DIAGNOSIS — R05 Cough: Secondary | ICD-10-CM | POA: Insufficient documentation

## 2018-11-26 DIAGNOSIS — K625 Hemorrhage of anus and rectum: Secondary | ICD-10-CM | POA: Diagnosis not present

## 2018-11-26 NOTE — Progress Notes (Signed)
On recall  °

## 2018-11-26 NOTE — Assessment & Plan Note (Addendum)
SYMPTOMS NOT CONTROLLED.  HAPPENS AFTER EATING FOR PAST 6 MOS.  DIFFERENTIAL DIAGNOSIS INCLUDES: ZENKER'S DIVERTICULUM AND LESS LIKELY A TE FISTULA OR OCCULT MEDIASTINAL/PULMONARY MALIGNANCY.  COMPLETE CHEST XRAY TODAY.  COMPLETE BARIUM SWALLOW NEXT WEEK. CONSIDER EGD/DIL IN THE NEAR FUTURE.  RESULTS WILL BE BACK IN 1-5 BUSINESS DAYS.  FOLLOW UP IN 4 MOS.   GREATER THAN 50% WAS SPENT IN COUNSELING & COORDINATION OF CARE WITH THE PATIENT AND DAUGHTER IN LAW: DISCUSSED DIFFERENTIAL DIAGNOSIS, PROCEDURE, BENEFITS, RISKS, AND MANAGEMENT OF POSTPRANDIAL COUGH/DYPSHAGIA. TOTAL ENCOUNTER TIME: 40 MINS.

## 2018-11-26 NOTE — Assessment & Plan Note (Addendum)
SYMPTOMS NOT CONTROLLED AND ASSOCIATED WITH COUGH AFTER SOLIDS OR LIQUIDS FOR PAST 6 MOS.  DIFFERENTIAL DIAGNOSIS INCLUDES: ZENKER'S DIVERTICULUM AND LESS LIKELY AN ESOPHAGEAL WEB/STRICTURE/MASS, A TE FISTULA, OR OCCULT MEDIASTINAL/PULMONARY MALIGNANCY.  COMPLETE CHEST XRAY TODAY.  COMPLETE BARIUM SWALLOW NEXT WEEK. CONSIDER EGD/DIL IN THE NEAR FUTURE. RESULTS WILL BE BACK IN 1-5 BUSINESS DAYS. FOLLOW UP IN 4 MOS.

## 2018-11-26 NOTE — Progress Notes (Signed)
cc'd to pcp 

## 2018-11-26 NOTE — Assessment & Plan Note (Signed)
SYMPTOMS CONTROLLED/RESOLVED.  CONTINUE TO MONITOR SYMPTOMS. 

## 2018-11-26 NOTE — Patient Instructions (Addendum)
COMPLETE CHEST XRAY TODAY.  COMPLETE BARIUM SWALLOW NEXT WEEK.   YOUR BIOPSY RESULTS WILL BE BACK IN 1-5 BUSINESS DAYS.  FOLLOW UP IN 4 MOS.

## 2018-11-26 NOTE — Progress Notes (Signed)
Subjective:    Patient ID: [name], female    DOB: Sep 05, 1936, 82 y.o.   MRN: [id number]  Roderic Scarce, MD   HPI Last seen aug 2015 FOR RECTAL BLEEDING. WHEN SHE DRINKS SHE HAS A DRY COUGH. C/O ON AND OFF THAT SOME THINGS GET STUCK. WHEN SHE EATS OR DRINK SHE COUGHS. NOT EVERY TIME. BEEN GOING ON FOR 6 MOS. NO WEIGHT LOSS. NO CXR. IN PAST COUPLE OF WEEKS HAS A SOFT WHEEZE WITH EXERTION. APPETITE: TOO GOOD. SOLIDS AND LIQUIDS BUT CAN BE MORE WITH LIQUIDS. BMs: FINE. ENERGY LEVEL: MAY BE SLOWING DOWN AT THIS POINT.    PT DENIES FEVER, CHILLS, HEMATOCHEZIA, HEMATEMESIS, nausea, vomiting, melena, diarrhea, CHEST PAIN, SHORTNESS OF BREATH,  CHANGE IN BOWEL IN HABITS, constipation, abdominal pain, problems with sedation, OR heartburn or indigestion.  Past Medical History:  Diagnosis Date  . Breast calcifications    right  . Cancer of upper-outer quadrant of female breast ([place]) 09/24/2011   surg -right  . Diabetes mellitus without complication ([place])   . Glaucoma   . Glaucoma   . Hyperlipidemia   . Hypertension   . Pneumonia    at age 68     Past Surgical History:  Procedure Laterality Date  . ABDOMINAL HYSTERECTOMY  1983  . APPENDECTOMY  1954  . BREAST BIOPSY  1980   right breast  . BREAST CYST EXCISION  10/14/2011   Procedure: CYST EXCISION BREAST;  Surgeon: Haywood Lasso, MD;  Location: [facility];  Service: General;  Laterality: Right;  Re-excision of right breast cancer  . BREAST LUMPECTOMY W/ NEEDLE LOCALIZATION  09/24/2011   Right Dr Margot Chimes  . COLONOSCOPY N/A 08/09/2014   Procedure: COLONOSCOPY;  Surgeon: Danie Binder, MD;  [facility];  Service: Endoscopy;  Laterality: N/A;  10:15  . HARDWARE REMOVAL Right 05/14/2013   Procedure: HARDWARE REMOVAL;  Surgeon: Carole Civil, MD;  [facility];  Service: Orthopedics;  Laterality: Right;  . ORIF PATELLA  03/04/2012   Procedure: OPEN REDUCTION INTERNAL (ORIF) FIXATION PATELLA;   Surgeon: Carole Civil, MD;  [facility];  Service: Orthopedics;  Laterality: Right;  . ROTATOR CUFF REPAIR  2010   left  . TONSILLECTOMY     as young adult   Allergies  Allergen Reactions  . Amoxicillin Other (See Comments)    Patient does not recall reaction.    Current Outpatient Medications  Medication Sig Dispense Refill  . anastrozole (ARIMIDEX) 1 MG tablet Take 1 mg by mouth daily.    . bimatoprost (LUMIGAN) 0.01 % SOLN Place 1 drop into the left eye at bedtime.     . brimonidine (ALPHAGAN) 0.2 % ophthalmic solution Place 1 drop into the left eye 2 (two) times daily.    Marland Kitchen donepezil (ARICEPT) 10 MG tablet Take 10 mg by mouth at bedtime.    . DORZOLAMIDE HCL-TIMOLOL MAL OP Place 1 drop into the left eye 2 (two) times daily.    . enalapril (VASOTEC) 10 MG tablet Take 10 mg by mouth daily.      . memantine (NAMENDA) 10 MG tablet Take 10 mg by mouth 2 (two) times daily.    Marland Kitchen omeprazole (PRILOSEC) 40 MG capsule Take 40 mg by mouth daily.    . pioglitazone (ACTOS) 15 MG tablet Take 15 mg by mouth daily.    . rosuvastatin (CRESTOR) 40 MG tablet Take 40 mg by mouth daily.      Marland Kitchen  aspirin (BAYER ASPIRIN) 325 MG tablet Take 325 mg by mouth daily.     . calcium carbonate 200 MG capsule Take 200 mg by mouth 2 (two) times daily with a meal.     . Cholecalciferol (VITAMIN D-3 PO) Take 1,000 Units by mouth daily.    . Cyanocobalamin (B-12 PO) Take 500 mg by mouth daily.    . diclofenac (CATAFLAM) 50 MG tablet Take 1 tablet (50 mg total) by mouth 2 (two) times daily. (Patient not taking: Reported on 11/26/2018) 60 tablet 2  . folic acid (FOLVITE) 025 MCG tablet Take 400 mcg by mouth daily.    Marland Kitchen gabapentin (NEURONTIN) 100 MG capsule Take 1 capsule (100 mg total) by mouth 3 (three) times daily. (Patient not taking: Reported on 11/26/2018) 90 capsule 2  . Ginkgo Biloba (GINKOBA PO) Take 120 mg by mouth daily.    . Omega-3 Fatty Acids (FISH OIL) 1000 MG CAPS Take 1 capsule by mouth daily.      . pioglitazone (ACTOS) 45 MG tablet Take 45 mg by mouth daily.      . potassium chloride SA (K-DUR,KLOR-CON) 20 MEQ tablet Take 20 mEq by mouth 2 (two) times daily.    . timolol (TIMOPTIC) 0.5 % ophthalmic solution Place 1 drop into both eyes daily.    Marland Kitchen triamterene-hydrochlorothiazide (DYAZIDE) 37.5-25 MG per capsule Take 1 capsule by mouth every morning.      . vitamin C (ASCORBIC ACID) 500 MG tablet Take 500 mg by mouth daily.    . vitamin E 400 UNIT capsule Take 400 Units by mouth daily.     Family History  Problem Relation Age of Onset  . Stroke Mother   . Heart disease Father   . Breast cancer Sister   . Colon cancer Neg Hx   . Colon polyps Neg Hx     Social History   Socioeconomic History  . Marital status: Widowed    Spouse name: Not on file  . Number of children: Not on file  . Years of education: Not on file  . Highest education level: Not on file  Occupational History  . Not on file  Social Needs  . Financial resource strain: Not on file  . Food insecurity:    Worry: Not on file    Inability: Not on file  . Transportation needs:    Medical: Not on file    Non-medical: Not on file  Tobacco Use  . Smoking status: Never Smoker  . Smokeless tobacco: Never Used  Substance and Sexual Activity  . Alcohol use: No  . Drug use: No  . Sexual activity: Yes    Birth control/protection: Surgical  Lifestyle  . Physical activity:    Days per week: Not on file    Minutes per session: Not on file  . Stress: Not on file  Relationships  . Social connections:    Talks on phone: Not on file    Gets together: Not on file    Attends religious service: Not on file    Active member of club or organization: Not on file    Attends meetings of clubs or organizations: Not on file    Relationship status: Not on file  Other Topics Concern  . Not on file  Social History Narrative   RETIRED FROM [employer]. WIDOWED-HUSBAND PASSED 2009. ONE SON-AGE  57 WORKS WITH [employer]. STOPPED DRIVING 3 YRS AGO. USED TO WALK MORE BUT CAN'T DUE TO A BAD KNEE.  Review of Systems PER HPI OTHERWISE ALL SYSTEMS ARE NEGATIVE.    Objective:   Physical Exam Vitals signs reviewed.  Constitutional:      General: She is not in acute distress.    Appearance: She is well-developed.  HENT:     Head: Normocephalic and atraumatic.     Mouth/Throat:     Pharynx: No oropharyngeal exudate.  Eyes:     General: No scleral icterus.    Pupils: Pupils are equal, round, and reactive to light.  Neck:     Musculoskeletal: Normal range of motion and neck supple.  Cardiovascular:     Rate and Rhythm: Normal rate and regular rhythm.     Heart sounds: Normal heart sounds.  Pulmonary:     Effort: Pulmonary effort is normal. No respiratory distress.     Breath sounds: Normal breath sounds.  Abdominal:     General: Bowel sounds are normal. There is no distension.     Palpations: Abdomen is soft.     Tenderness: There is no abdominal tenderness.  Musculoskeletal:     Right lower leg: No edema.     Left lower leg: No edema.     Comments: WALKS ASSISTED WITH A CANE. ABLE TO GET ONE EXAM TABLE WITHOUT ASSISTANCE   Lymphadenopathy:     Cervical: No cervical adenopathy.  Neurological:     Mental Status: She is alert and oriented to person, place, and time. Mental status is at baseline.  Psychiatric:        Mood and Affect: Mood normal.        Behavior: Behavior normal.           Assessment & Plan:

## 2018-11-30 NOTE — Progress Notes (Signed)
CC'D TO PCP °

## 2018-12-04 ENCOUNTER — Telehealth: Payer: Self-pay | Admitting: Gastroenterology

## 2018-12-04 ENCOUNTER — Ambulatory Visit (HOSPITAL_COMMUNITY)
Admission: RE | Admit: 2018-12-04 | Discharge: 2018-12-04 | Disposition: A | Discharge status: Home / Self Care | Payer: Medicare Other | Source: Ambulatory Visit | Attending: Gastroenterology | Admitting: Gastroenterology

## 2018-12-04 DIAGNOSIS — R059 Cough, unspecified: Secondary | ICD-10-CM

## 2018-12-04 DIAGNOSIS — R1310 Dysphagia, unspecified: Secondary | ICD-10-CM | POA: Diagnosis not present

## 2018-12-04 DIAGNOSIS — R05 Cough: Secondary | ICD-10-CM

## 2018-12-04 NOTE — Telephone Encounter (Signed)
  PLEASE CALL PT. HER BARIUM SWALLOW SHOWS A SMALL HIATAL HERNIA. SHE DOES NOT HAVE A STRICTURE OR AN OBSTRUCTION. SHE DOES NOT NEED AN EGD.  SHE SHOULD COMPLETE A MODIFIED BARIUM SWALLOW(DX: POSTPRANDIAL COUGH) TO ASSESS FOR ASPIRATION WHEN SHE SWALLOWS. WE WILL ALSO REFER HER TO PULMONARY DOCTOR FOR AN EVALUATION AS WELL, DX: PERSISTENT NONPRODUCTIVE COUGH.

## 2018-12-04 NOTE — Telephone Encounter (Signed)
Tried calling pt. Phone rung, not able to leave a VM.

## 2018-12-07 ENCOUNTER — Telehealth: Payer: Self-pay | Admitting: Gastroenterology

## 2018-12-07 NOTE — Telephone Encounter (Signed)
Order entered for [facility] (rehab dept will call pt to schedule). Referral faxed to Dr. Luan Pulling (pulmonary) and sent via [employer].

## 2018-12-07 NOTE — Telephone Encounter (Signed)
See separate result note, I have spoken to him.

## 2018-12-07 NOTE — Telephone Encounter (Signed)
PT is aware. She wants her son, Ikia Cincotta, to call and get the info and see what he says.  She will have him call me.

## 2018-12-07 NOTE — Addendum Note (Signed)
Addended by: Zara Council C on: 12/07/2018 12:51 PM   Modules accepted: Orders

## 2018-12-07 NOTE — Telephone Encounter (Signed)
[phone] PATIENT SON RETURNED CALL, PLEASE CALL BACK WHEN YOU CAN

## 2018-12-07 NOTE — Telephone Encounter (Signed)
Pt's son is aware. Ok to schedule the Barium swallow and refer to pulmonary.  Please call Johnney Killian to do so @ [phone].

## 2019-01-20 ENCOUNTER — Other Ambulatory Visit (HOSPITAL_COMMUNITY): Payer: Self-pay | Admitting: Specialist

## 2019-01-20 DIAGNOSIS — R1319 Other dysphagia: Secondary | ICD-10-CM

## 2019-01-21 ENCOUNTER — Telehealth (HOSPITAL_COMMUNITY): Payer: Self-pay | Admitting: Nurse Practitioner

## 2019-01-21 NOTE — Telephone Encounter (Signed)
01/21/19  left a message at the [phone] number to let know about MBSS.  I called the home # but it would let me leave a message and said call couldn't be completed...Marland KitchenMarland Kitchen

## 2019-01-27 ENCOUNTER — Encounter (HOSPITAL_COMMUNITY): Payer: Self-pay

## 2019-01-27 ENCOUNTER — Ambulatory Visit (HOSPITAL_COMMUNITY): Payer: Medicare Other | Admitting: Speech Pathology

## 2019-01-27 ENCOUNTER — Other Ambulatory Visit (HOSPITAL_COMMUNITY): Payer: Medicare Other

## 2019-02-03 ENCOUNTER — Ambulatory Visit (HOSPITAL_COMMUNITY): Payer: Medicare Other | Attending: Pulmonary Disease | Admitting: Speech Pathology

## 2019-02-03 ENCOUNTER — Other Ambulatory Visit: Payer: Self-pay

## 2019-02-03 ENCOUNTER — Encounter (HOSPITAL_COMMUNITY): Payer: Self-pay | Admitting: Speech Pathology

## 2019-02-03 ENCOUNTER — Ambulatory Visit (HOSPITAL_COMMUNITY)
Admission: RE | Admit: 2019-02-03 | Discharge: 2019-02-03 | Disposition: A | Discharge status: Home / Self Care | Payer: Medicare Other | Source: Ambulatory Visit | Attending: Pulmonary Disease | Admitting: Pulmonary Disease

## 2019-02-03 DIAGNOSIS — R1312 Dysphagia, oropharyngeal phase: Secondary | ICD-10-CM | POA: Diagnosis not present

## 2019-02-03 DIAGNOSIS — R1319 Other dysphagia: Secondary | ICD-10-CM | POA: Insufficient documentation

## 2019-02-03 NOTE — Therapy (Signed)
[name], [place], [address] Phone: [phone]   Fax:  [phone]  Modified Barium Swallow  Patient Details  Name: [name] MRN: [id number] Date of Birth: [birth year] No data recorded  Encounter Date: 02/03/2019  End of Session - 02/03/19 1513    Visit Number  1    Number of Visits  1    Authorization Type  [employer] Medicare    SLP Start Time  4193    SLP Stop Time   1430    SLP Time Calculation (min)  37 min    Activity Tolerance  Patient tolerated treatment well       Past Medical History:  Diagnosis Date  . Breast calcifications    right  . Cancer of upper-outer quadrant of female breast ([facility]) 09/24/2011   surg -right  . Diabetes mellitus without complication ([place] [facility])   . Glaucoma   . Glaucoma   . Hyperlipidemia   . Hypertension   . Pneumonia    at age 74      Past Surgical History:  Procedure Laterality Date  . ABDOMINAL HYSTERECTOMY  1983  . APPENDECTOMY  1954  . BREAST BIOPSY  1980   right breast  . BREAST CYST EXCISION  10/14/2011   Procedure: CYST EXCISION BREAST;  Surgeon: Brandi Lasso, MD;  Location: [facility];  Service: General;  Laterality: Right;  Re-excision of right breast cancer  . BREAST LUMPECTOMY W/ NEEDLE LOCALIZATION  09/24/2011   Right Dr [name]  . COLONOSCOPY N/A 08/09/2014   Procedure: COLONOSCOPY;  Surgeon: Brandi Binder, MD;  [facility];  Service: Endoscopy;  Laterality: N/A;  10:15  . HARDWARE REMOVAL Right 05/14/2013   Procedure: HARDWARE REMOVAL;  Surgeon: Brandi Civil, MD;  Location: [facility];  Service: Orthopedics;  Laterality: Right;  . ORIF PATELLA  03/04/2012   Procedure: OPEN REDUCTION INTERNAL (ORIF) FIXATION PATELLA;  Surgeon: Brandi Civil, MD;  Location: [facility];  Service: Orthopedics;  Laterality: Right;  . ROTATOR CUFF REPAIR  2010   left  . TONSILLECTOMY     as young adult    There were no vitals filed for this  visit.  Subjective Assessment - 02/03/19 1505    Subjective  "She occasionally coughs after she eats." -Pt's daughter in law    Patient is accompained by:  Family member    Special Tests  MBSS    Currently in Pain?  No/denies          General - 02/03/19 1506      General Information   Date of Onset  12/04/18    HPI  [name] is an 83 yo female who was referred for MBSS by Dr. Barney Johnston due to Pt with c/o post prandial cough. She had a BPE at the end of December which was unremarkable except for a small transient sliding hiatal hernia. [name] was accompanied to today's appointment by her daughter in law who provided some background information. She reports that Pt has mild dementia.     Type of Study  MBS-Modified Barium Swallow Study    Previous Swallow Assessment  Barium swallow 11/2018 showing small sliding hiatal hernia, no penetration/aspiration    Diet Prior to this Study  Regular;Thin liquids    Temperature Spikes Noted  No    Respiratory Status  Room air    History of Recent Intubation  No    Behavior/Cognition  Alert;Cooperative;Pleasant  mood    Oral Cavity Assessment  Within Functional Limits    Oral Care Completed by SLP  No    Oral Cavity - Dentition  Dentures, top;Dentures, bottom    Vision  Functional for self feeding    Self-Feeding Abilities  Able to feed self    Patient Positioning  Upright in chair    Baseline Vocal Quality  Normal    Volitional Cough  Strong    Volitional Swallow  Able to elicit    Anatomy  Within functional limits    Pharyngeal Secretions  Not observed secondary MBS         Oral Preparation/Oral Phase - 02/03/19 1508      Oral Preparation/Oral Phase   Oral Phase  Impaired      Oral - Thin   Oral - Thin Cup  Decreased bolus cohesion   premature spillage     Oral - Solids   Oral - Puree  Lingual pumping;Decreased bolus cohesion;Delayed A-P transit;Piecemeal swallowing;Oral residue    Oral - Regular  Imparied  mastication;Decreased bolus cohesion;Delayed A-P transit;Oral residue;Piecemeal swallowing      Electrical stimulation - Oral Phase   Was Electrical Stimulation Used  No       Pharyngeal Phase - 02/03/19 1511      Pharyngeal Phase   Pharyngeal Phase  Impaired      Pharyngeal - Thin   Pharyngeal- Thin Teaspoon  Swallow initiation at vallecula;Swallow initiation at pyriform sinus;Penetration/Aspiration during swallow;Reduced epiglottic inversion;Pharyngeal residue - valleculae    Pharyngeal  Material does not enter airway;Material enters airway, remains ABOVE vocal cords then ejected out    Pharyngeal- Thin Cup  Swallow initiation at vallecula;Swallow initiation at pyriform sinus;Pharyngeal residue - valleculae;Lateral channel residue;Penetration/Aspiration during swallow    Pharyngeal  Material does not enter airway;Material enters airway, remains ABOVE vocal cords then ejected out;Material enters airway, CONTACTS cords and then ejected out    Pharyngeal- Thin Straw  Swallow initiation at vallecula;Swallow initiation at pyriform sinus;Pharyngeal residue - valleculae;Pharyngeal residue - pyriform      Pharyngeal - Solids   Pharyngeal- Puree  Swallow initiation at vallecula   BOT residue   Pharyngeal- Regular  Delayed swallow initiation-pyriform sinuses    Pharyngeal- Pill  Swallow initiation at vallecula;Pharyngeal residue - valleculae   brief stasis of barium tablet in valleculae     Pharyngeal Phase - Comment   Pharyngeal Comment  trace, flash penetration of thin to the vocal cord; no aspiration observed      Electrical Stimulation - Pharyngeal Phase   Was Electrical Stimulation Used  No       Cricopharyngeal Phase - 02/03/19 1512      Cervical Esophageal Phase   Cervical Esophageal Phase  Impaired      Cervical Esophageal Phase - Thin   Thin Cup  Prominent cricopharyngeal segment        Plan - 02/03/19 1514    Clinical Impression Statement  Pt presents with mild  oropharyngeal phase dysphagia characterized by impaired mastication and lingual control resulting in decreased bolus cohesiveness, premature spillage, piecemeal deglutition and min base of tongue residuals with puree and thin; pharyngeal phase marked by min delay in swallow initiation with swallow trigger as bolus reached pyriforms resulting in flash laryngeal penetration of thins occasionally to the vocal cords without aspiration. Pt with vallecular and pyriform residuals after straw sips which clear with independent repeat swallow. Pt with occasional base of tongue residuals, however she eventually swallows. Pt's oral phase mastication  pattern likely impacted by dementia. The study and recommendations were reviewed with the Pt and her daughter in law. Recommend regular textures and thin liquids and Pt encouraged to move tongue and masticate solids, implement effortful/hard swallow, and swallow 2x for each bite/sip along with standard aspiration and reflux precautions. No further SLP services indicated at this time. OK for po medications whole with liquids.   Consulted and Agree with Plan of Care  Patient;Family member/caregiver       Patient will benefit from skilled therapeutic intervention in order to improve the following deficits and impairments:   Dysphagia, oropharyngeal phase    Recommendations/Treatment - 02/03/19 1512      Swallow Evaluation Recommendations   SLP Diet Recommendations  Age appropriate regular;Thin    Liquid Administration via  Cup;Straw    Medication Administration  Whole meds with liquid    Supervision  Patient able to self feed;Intermittent supervision to cue for compensatory strategies    Compensations  Multiple dry swallows after each bite/sip;Effortful swallow    Postural Changes  Seated upright at 90 degrees;Remain upright for at least 30 minutes after feeds/meals       Prognosis - 02/03/19 1512      Prognosis   Prognosis for Safe Diet Advancement  Good     Barriers to Reach Goals  Cognitive deficits      Individuals Consulted   Consulted and Agree with Results and Recommendations  Patient;Family member/caregiver    Family Member Consulted  daughter in law, [place]    Report Sent to   Referring physician       Problem List Patient Active Problem List   Diagnosis Date Noted  . Cough 11/26/2018  . Dysphagia, idiopathic 11/26/2018  . Lumbar spondylosis with myelopathy 11/15/2014  . Cervical spondylosis without myelopathy 11/15/2014  . Osteoarthritis of spine with radiculopathy, lumbar region 11/15/2014  . Lumbago 11/15/2014  . Left hip pain 11/15/2014  . Right hip pain 11/15/2014  . Rectal bleeding 08/03/2014  . Patellar tendinitis 06/07/2014  . Edema 05/06/2013  . Pes anserinus bursitis 04/20/2013  . Retained orthopedic hardware 04/20/2013  . Knee pain 04/20/2013  . Rotator cuff arthropathy 08/26/2012  . Rotator cuff tear, right 08/26/2012  . Lymphadenopathy, axillary 07/03/2012  . Patella fracture-right  03/06/2012  . Cancer of upper-outer quadrant of female breast ([place]) 09/24/2011  . DIABETES 06/29/2009  . SHOULDER PAIN 06/29/2009  . ELBOW PAIN 06/29/2009  . Disorders of bursae and tendons in shoulder region, unspecified 06/29/2009  . HIGH BLOOD PRESSURE 06/29/2009   Thank you,  Genene Churn, [place]  [facility] 02/03/2019, 3:40 PM  [facility] [address] [place], [place], [address] Phone: [phone]   Fax:  [phone]  Name: [name] MRN: [id number] Date of Birth: January 24, 1936

## 2019-02-22 ENCOUNTER — Encounter: Payer: Self-pay | Admitting: Gastroenterology

## 2019-04-15 IMAGING — RF DG SWALLOWING FUNCTION
1 series · 1 of 1 positions shown · non-contrast
Comparison: None.

CLINICAL DATA: Dysphagia, cough after eating.

EXAM:
MODIFIED BARIUM SWALLOW
TECHNIQUE: Different consistencies of barium were administered orally to the
patient by the Speech Pathologist. Imaging of the pharynx was
performed in the lateral projection. The radiologist was present in
the fluoroscopy room for this study, providing personal supervision.
FLUOROSCOPY TIME:  Radiation Exposure Index (if provided by the
fluoroscopic device): 25.5 mGy.

[Series 2: cp_standard · 0.25mm/px · 1 of 1 slices shown]
[im 1/1]
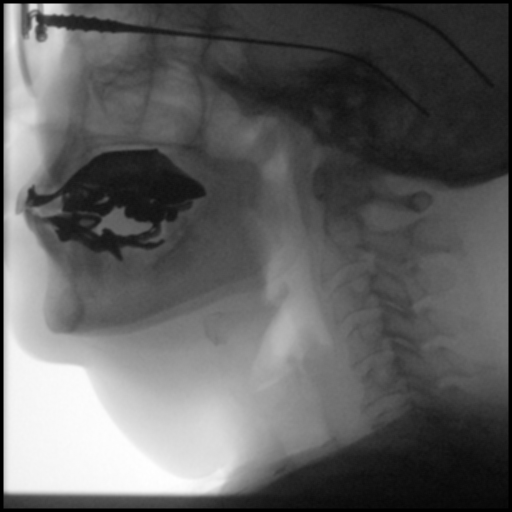

[1 of 1 positions shown; findings below may reference images not displayed]

FINDINGS: Tracheal penetration was noted with thin administration, although
none was observed with the straw. No definite abnormality seen with
involving the puree administration. Tablet was given which
momentarily was delayed in the vallecula, but eventually cleared
through esophagus into stomach.
IMPRESSION: See above.

Please refer to the Speech Pathologists report for complete details
and recommendations.

## 2021-09-07 ENCOUNTER — Observation Stay

## 2021-09-07 ENCOUNTER — Emergency Department: Admit: 2021-09-08 | Payer: MEDICARE | Primary: Nurse Practitioner

## 2021-09-07 ENCOUNTER — Inpatient Hospital Stay
Admit: 2021-09-07 | Discharge: 2021-09-08 | Disposition: A | Discharge status: Home Health | Payer: MEDICARE | Attending: Emergency Medicine

## 2021-09-07 DIAGNOSIS — R4182 Altered mental status, unspecified: Secondary | ICD-10-CM

## 2021-09-07 LAB — CBC WITH AUTO DIFFERENTIAL
Basophils %: 0.7 % (ref 0–3)
Eosinophils %: 3.6 % (ref 0–5)
Hematocrit: 38.1 % (ref 37.0–50.0)
Hemoglobin: 12 gm/dl — ABNORMAL LOW (ref 13.0–17.2)
Immature Granulocytes: 0.1 % (ref 0.0–3.0)
Lymphocytes %: 39.3 % (ref 28–48)
MCH: 31.7 pg (ref 25.4–34.6)
MCHC: 31.5 gm/dl (ref 30.0–36.0)
MCV: 100.5 fL — ABNORMAL HIGH (ref 80.0–98.0)
MPV: 10.2 fL — ABNORMAL HIGH (ref 6.0–10.0)
Monocytes %: 11.5 % (ref 1–13)
Neutrophils %: 44.8 % (ref 34–64)
Nucleated RBCs: 0 (ref 0–0)
Platelets: 243 10*3/uL (ref 140–450)
RBC: 3.79 M/uL (ref 3.60–5.20)
RDW-SD: 54.1 — ABNORMAL HIGH (ref 36.4–46.3)
WBC: 6.9 10*3/uL (ref 4.0–11.0)

## 2021-09-07 LAB — CBC WITH AUTOMATED DIFF
BASOPHILS: 0.7 % (ref 0–3)
EOSINOPHILS: 3.6 % (ref 0–5)
HCT: 38.1 % (ref 37.0–50.0)
HGB: 12 gm/dl — ABNORMAL LOW (ref 13.0–17.2)
IMMATURE GRANULOCYTES: 0.1 % (ref 0.0–3.0)
LYMPHOCYTES: 39.3 % (ref 28–48)
MCH: 31.7 pg (ref 25.4–34.6)
MCHC: 31.5 gm/dl (ref 30.0–36.0)
MCV: 100.5 fL — ABNORMAL HIGH (ref 80.0–98.0)
MONOCYTES: 11.5 % (ref 1–13)
MPV: 10.2 fL — ABNORMAL HIGH (ref 6.0–10.0)
NEUTROPHILS: 44.8 % (ref 34–64)
NRBC: 0 (ref 0–0)
PLATELET: 243 10*3/uL (ref 140–450)
RBC: 3.79 M/uL (ref 3.60–5.20)
RDW-SD: 54.1 — ABNORMAL HIGH (ref 36.4–46.3)
WBC: 6.9 10*3/uL (ref 4.0–11.0)

## 2021-09-07 MED ORDER — SODIUM CHLORIDE 0.9 % IJ SYRG
Freq: Once | INTRAMUSCULAR | Status: AC
Start: 2021-09-07 — End: 2021-09-08

## 2021-09-07 NOTE — ED Notes (Addendum)
SBAR attempt   No report given   Informed charge RN Judeth Cornfield  Permission to transfer  Pt sent up to floor with tech   Released care of pt

## 2021-09-07 NOTE — ED Notes (Addendum)
Attempted to call report   Unable to give report   Will attempt again

## 2021-09-07 NOTE — ED Notes (Addendum)
Rounded on pt   Pt awake and in bed   Pt alert and calm   Informed pt about procedure  Verified pt using 2 pt identifiers   Inserted 18g IV in the right [facility]  Labs taken and labeled according to protocol   Secured line with Tegaderm   Pt tolerated well and denied pain at site  Flushed easily with no swelling or redness   Pt tolerated well   Specimen taken to lab per protocol   Pt informed about lab wait time for results   Updated about plan of care   No acute needs no acute distress

## 2021-09-07 NOTE — ED Provider Notes (Signed)
HPI   85 year old female with history of dementia, hypertension, stroke with right-sided deficits, right eye blindness who was sent via EMS due to altered mental status.  Per EMS, family noticed that she was more confused than the normal and they called EMS.  When EMS arrived patient was back to baseline but they decided to bring her to the ED for further evaluation.  Unable to get information from the patient due to dementia.    Past Medical History:   Diagnosis Date    Dementia ([place])     Hypertension     Stroke ([place])        No past surgical history on file.      No family history on file.    Social History     Socioeconomic History    Marital status: WIDOWED     Spouse name: Not on file    Number of children: Not on file    Years of education: Not on file    Highest education level: Not on file   Occupational History    Not on file   Tobacco Use    Smoking status: Not on file    Smokeless tobacco: Not on file   Substance and Sexual Activity    Alcohol use: Not on file    Drug use: Not on file    Sexual activity: Not on file   Other Topics Concern    Not on file   Social History Narrative    Not on file     Social Determinants of Health     Financial Resource Strain: Not on file   Food Insecurity: Not on file   Transportation Needs: Not on file   Physical Activity: Not on file   Stress: Not on file   Social Connections: Not on file   Intimate Partner Violence: Not on file   Housing Stability: Not on file         ALLERGIES: Patient has no known allergies.    Review of Systems   Unable to perform ROS: Mental status change     Vitals:    09/07/21 2121 09/07/21 2131 09/07/21 2201 09/07/21 2301   BP: (!) 174/59 (!) 179/73 (!) 168/60 (!) 157/58   Pulse: (!) 44 (!) 44 (!) 51 (!) 51   Resp: 17 12 15 15    Temp:       SpO2: 100% 98% 100% 100%   Weight:       Height:                Physical Exam  Vitals and nursing note reviewed. Exam conducted with a chaperone present.   Constitutional:       General: She is not in acute  distress.     Appearance: Normal appearance. She is well-developed. She is not ill-appearing, toxic-appearing or diaphoretic.   HENT:      Head: Normocephalic and atraumatic.      Nose: Nose normal.      Mouth/Throat:      Mouth: Mucous membranes are moist.   Eyes:      Extraocular Movements: Extraocular movements intact.      Conjunctiva/sclera: Conjunctivae normal.      Pupils: Pupils are equal, round, and reactive to light.      Comments: Right eye blindness   Cardiovascular:      Rate and Rhythm: Normal rate and regular rhythm.      Pulses: Normal pulses.  Posterior tibial pulses are 2+ on the right side and 2+ on the left side.      Heart sounds: Normal heart sounds.   Pulmonary:      Effort: Pulmonary effort is normal.      Breath sounds: Normal breath sounds.   Abdominal:      General: Bowel sounds are normal. There is no distension.      Palpations: Abdomen is soft.      Tenderness: There is no abdominal tenderness. There is no guarding or rebound.   Musculoskeletal:         General: No swelling or tenderness. Normal range of motion.      Cervical back: Normal range of motion and neck supple.      Right lower leg: No edema.      Left lower leg: No edema.   Skin:     General: Skin is warm and dry.      Capillary Refill: Capillary refill takes less than 2 seconds.   Neurological:      General: No focal deficit present.      Mental Status: She is alert. Mental status is at baseline. She is disoriented.      GCS: GCS eye subscore is 4. GCS verbal subscore is 4. GCS motor subscore is 6.      Cranial Nerves: Facial asymmetry present.      Sensory: Sensation is intact.      Motor: Weakness (Right arm and right leg due to prior stroke) and pronator drift (Right arm due to prior stroke) present.      Comments: Oriented to person and place, not to time.        MDM  The patient presents with altered mental status with a differential diagnosis of  cerebral hemorrhage, chronic dementia, CVA, encephalopathy, and  UTI.      8:33 PM Patient's daughter states that since yesterday (last known well was 2 days ago) she noticed worsening of confusion.  Today, she noticed that her mom had an episode of less responsiveness and she decided to call EMS.  She also reports that for the last couple days she noticed that her urine smells strong.  She denies fever, nausea or vomiting, diarrhea, cough, chest pain, abdominal pain, focal weakness.     EKG: Sinus bradycardia, no diagnostic ST or T-wave changes, no STEMI as interpreted by myself    Recent Results (from the past 12 hour(s))   CBC WITH AUTOMATED DIFF    Collection Time: 09/07/21  7:50 PM   Result Value Ref Range    WBC 6.9 4.0 - 11.0 1000/mm3    RBC 3.79 3.60 - 5.20 M/uL    HGB 12.0 (L) 13.0 - 17.2 gm/dl    HCT 38.1 37.0 - 50.0 %    MCV 100.5 (H) 80.0 - 98.0 fL    MCH 31.7 25.4 - 34.6 pg    MCHC 31.5 30.0 - 36.0 gm/dl    PLATELET 243 140 - 450 1000/mm3    MPV 10.2 (H) 6.0 - 10.0 fL    RDW-SD 54.1 (H) 36.4 - 46.3      NRBC 0 0 - 0      IMMATURE GRANULOCYTES 0.1 0.0 - 3.0 %    NEUTROPHILS 44.8 34 - 64 %    LYMPHOCYTES 39.3 28 - 48 %    MONOCYTES 11.5 1 - 13 %    EOSINOPHILS 3.6 0 - 5 %    BASOPHILS 0.7 0 - 3 %  METABOLIC PANEL, COMPREHENSIVE    Collection Time: 09/07/21  7:50 PM   Result Value Ref Range    Potassium 4.0 3.5 - 5.1 mEq/L    Chloride 114 (H) 98 - 107 mEq/L    Sodium 143 136 - 145 mEq/L    CO2 21 20 - 31 mEq/L    Glucose 80 74 - 106 mg/dl    BUN 14 9 - 23 mg/dl    Creatinine 0.74 0.55 - 1.02 mg/dl    GFR est AA >60.0      GFR est non-AA >60      Calcium 9.5 8.7 - 10.4 mg/dl    Anion gap 8 5 - 15 mmol/L    AST (SGOT) 17.0 0.0 - 33.9 U/L    ALT (SGPT) 16 10 - 49 U/L    Alk. phosphatase 169 (H) 46 - 116 U/L    Bilirubin, total 0.70 0.30 - 1.20 mg/dl    Protein, total 6.7 5.7 - 8.2 gm/dl    Albumin 3.5 3.4 - 5.0 gm/dl   TROPONIN-HIGH SENSITIVITY    Collection Time: 09/07/21  7:50 PM   Result Value Ref Range    Troponin-High Sensitivity 9 0 - 34 ng/L   LACTIC ACID     Collection Time: 09/07/21  7:50 PM   Result Value Ref Range    Lactic Acid 0.8 0.5 - 2.2 mmol/L   URINALYSIS W/ RFLX MICROSCOPIC    Collection Time: 09/07/21  9:05 PM   Result Value Ref Range    Color PALE YELLOW (A) YELLOW,STRAW      Appearance CLEAR CLEAR      Glucose NEGATIVE NEGATIVE,Negative mg/dl    Bilirubin NEGATIVE NEGATIVE,Negative      Ketone NEGATIVE NEGATIVE,Negative mg/dl    Specific gravity <=1.005 1.005 - 1.030      Blood NEGATIVE NEGATIVE,Negative      pH (UA) 6.5 5.0 - 9.0      Protein NEGATIVE NEGATIVE,Negative mg/dl    Urobilinogen 0.2 0.0 - 1.0 mg/dl    Nitrites NEGATIVE NEGATIVE,Negative      Leukocyte Esterase NEGATIVE NEGATIVE,Negative     POC URINE MACROSCOPIC    Collection Time: 09/07/21  9:20 PM   Result Value Ref Range    Glucose Negative NEGATIVE,Negative mg/dl    Bilirubin Negative NEGATIVE,Negative      Ketone Negative NEGATIVE,Negative mg/dl    Specific gravity <=1.005 1.005 - 1.030      Blood Negative NEGATIVE,Negative      pH (UA) 6.5 5 - 9      Protein Negative NEGATIVE,Negative mg/dl    Urobilinogen 0.2 0.0 - 1.0 EU/dl    Nitrites Negative NEGATIVE,Negative      Leukocyte Esterase Negative NEGATIVE,Negative      Color Yellow      Appearance Clear     GLUCOSE, POC    Collection Time: 09/08/21 12:04 AM   Result Value Ref Range    Glucose (POC) 71 65 - 105 mg/dL      XR CHEST SNGL V    Result Date: 09/07/2021  IMPRESSION:  No acute cardiopulmonary process. Electronically signed by: Cecilio Asper, MD 09/07/2021 9:22 PM EDT     CT HEAD WO CONT    Result Date: 09/07/2021  IMPRESSION: 1.  Subacute versus chronic left frontal lobe infarction. Difficult to delineate without comparison exam. This could be confirmed with MRI, as clinically indicated. 2.  Otherwise, no acute intracranial process 3.  Chronic microvascular changes Electronically signed by: Cecilio Asper, MD 09/07/2021  8:58 PM EDT       CXR: No obvious acute cardiopulmonary process seen as interpreted by myself.    Head CT: No  obvious acute intracranial hemorrhage seen as interpreted by myself.    10:03 PM ED work-up shows questionable subacute versus chronic left frontal lobe infarction, otherwise unremarkable.  Patient and daughter were informed of the results.  I will discuss admission with hospitalist.    10:31 PM D/w Dr. Dawson Bills who will admit to med/tele observation.     ProceduresNA        ICD-10-CM ICD-9-CM   1. Altered mental status, unspecified altered mental status type  R41.82 780.97   2. Cerebrovascular accident (CVA), unspecified mechanism ([place])  I63.9 434.91

## 2021-09-07 NOTE — ED Notes (Addendum)
Rounded on pt   Pt awake and in bed   Pt alert and confused   Informed pt about procedure  Explained why needed  Pt family understood teaching   Verified pt using 2 pt identifiers   Placed purwick  Pt tolerated well and denied pain   Pt cleaned and given pericare   Pt offered comfort measures   Pt has call bell at side of bed   All acute needs met

## 2021-09-07 NOTE — H&P (Addendum)
Medicine History and Physical    Patient: Amy Sharp Age: 85 y.o. 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 year old female with hx of CVA with right side weakness who presents with altered mental status. At baseline oriented to person only, however able to have conversation with family. Today, she was more weak and confused. Noted to have chronic right sided weakness. Patient is hypertensive on presentation. No signs of infectious etiology. ECG shows sinus bradycardia.   CT head shows subacute versus chronic left frontal lobe infarction.        Review of Systems - 12 Point ROS -ve except what is noted in the HPI.     Past Medical History:  No past medical history on file.    Past Surgical History:  No past surgical history on file.    Family History:  No family history on file.    Social History:  Social History     Socioeconomic History    Marital status: WIDOWED       Home Medications:  Prior to Admission medications    Not on File       Allergies:  No Known Allergies      Physical Exam:   Visit Vitals  BP (!) 179/73   Pulse (!) 44   Temp 98.6 ??F (37 ??C)   Resp 12   Ht 5' 6"  (1.676 m)   Wt 75.3 kg (166 lb)   SpO2 98%   BMI 26.79 kg/m??       Physical Exam:  General appearance: alert, cooperative, no distress, appears stated age  Head: Normocephalic, without obvious  abnormality, atraumatic  Neck: supple, trachea midline  Lungs: clear to auscultation bilaterally  Heart: regular rate and rhythm, S1, S2 normal, no murmur, click, rub or gallop  Abdomen: soft, non-tender. Bowel sounds normal. No masses,  no organomegaly  Extremities: 4/5 right upper and lower extremity  Skin: Skin color, texture, turgor normal. No rashes or lesions  Neurologic: Grossly normal      Intake and Output:  Current Shift:  No intake/output data recorded.  Last three shifts:  No intake/output data recorded.    Lab/Data Reviewed:  Lab:   Recent Results (from the past 12 hour(s))   CBC WITH AUTOMATED DIFF    Collection Time: 09/07/21  7:50 PM   Result Value Ref Range    WBC 6.9 4.0 - 11.0 1000/mm3    RBC 3.79 3.60 - 5.20 M/uL    HGB 12.0 (L) 13.0 - 17.2 gm/dl    HCT 38.1 37.0 - 50.0 %    MCV 100.5 (H) 80.0 - 98.0 fL  MCH 31.7 25.4 - 34.6 pg    MCHC 31.5 30.0 - 36.0 gm/dl    PLATELET 243 140 - 450 1000/mm3    MPV 10.2 (H) 6.0 - 10.0 fL    RDW-SD 54.1 (H) 36.4 - 46.3      NRBC 0 0 - 0      IMMATURE GRANULOCYTES 0.1 0.0 - 3.0 %    NEUTROPHILS 44.8 34 - 64 %    LYMPHOCYTES 39.3 28 - 48 %    MONOCYTES 11.5 1 - 13 %    EOSINOPHILS 3.6 0 - 5 %    BASOPHILS 0.7 0 - 3 %   METABOLIC PANEL, COMPREHENSIVE    Collection Time: 09/07/21  7:50 PM   Result Value Ref Range    Potassium 4.0 3.5 - 5.1 mEq/L    Chloride 114 (H) 98 - 107 mEq/L    Sodium 143 136 - 145 mEq/L    CO2 21 20 - 31 mEq/L    Glucose 80 74 - 106 mg/dl    BUN 14 9 - 23 mg/dl    Creatinine 0.74 0.55 - 1.02 mg/dl    GFR est AA >60.0      GFR est non-AA >60      Calcium 9.5 8.7 - 10.4 mg/dl    Anion gap 8 5 - 15 mmol/L    AST (SGOT) 17.0 0.0 - 33.9 U/L    ALT (SGPT) 16 10 - 49 U/L    Alk. phosphatase 169 (H) 46 - 116 U/L    Bilirubin, total 0.70 0.30 - 1.20 mg/dl    Protein, total 6.7 5.7 - 8.2 gm/dl    Albumin 3.5 3.4 - 5.0 gm/dl   TROPONIN-HIGH SENSITIVITY    Collection Time: 09/07/21  7:50 PM   Result Value Ref Range    Troponin-High Sensitivity 9 0 - 34  ng/L   LACTIC ACID    Collection Time: 09/07/21  7:50 PM   Result Value Ref Range    Lactic Acid 0.8 0.5 - 2.2 mmol/L   POC URINE MACROSCOPIC    Collection Time: 09/07/21  9:20 PM   Result Value Ref Range    Glucose Negative NEGATIVE,Negative mg/dl    Bilirubin Negative NEGATIVE,Negative      Ketone Negative NEGATIVE,Negative mg/dl    Specific gravity <=1.005 1.005 - 1.030      Blood Negative NEGATIVE,Negative      pH (UA) 6.5 5 - 9      Protein Negative NEGATIVE,Negative mg/dl    Urobilinogen 0.2 0.0 - 1.0 EU/dl    Nitrites Negative NEGATIVE,Negative      Leukocyte Esterase Negative NEGATIVE,Negative      Color Yellow      Appearance Clear         Imaging:    XR CHEST SNGL V    Result Date: 09/07/2021  XR CHEST SNGL V Clinical indication: AMS. Comparison: None Support devices: None Lungs: Lungs are clear. No pleural effusions. No pneumothorax. Cardiomediastinal: Normal morphology for radiographic technique. Bones/soft tissues: No acute abnormalities. Screw anchor in the left humeral head noted.     IMPRESSION:  No acute cardiopulmonary process. Electronically signed by: Cecilio Asper, MD 09/07/2021 9:22 PM EDT     CT HEAD WO CONT    Result Date: 09/07/2021  HEAD CT WITHOUT CONTRAST INDICATION: AMS. COMPARISON: No relevant studies. TECHNIQUE: Transaxial images were obtained from the skull base to the vertex without intravenous contrast with coronal and sagittal reformatted images. All CT exams at  this facility use one or more dose reduction techniques including automatic exposure control, mA/kV adjustment per patient's size, or iterative reconstruction technique. FINDINGS: Scout image: Negative. Parenchyma: Hypoattenuation along the left frontal lobe left ACA vascular territory compatible with subacute or chronic infarction. Small chronic lacunar infarct left basal ganglia. Remainder of the gray-white matter differentiation is preserved. Age-related generalized cerebral volume loss. Empty sella morphology.  Extra-axial Collection:  None Ventricles: No hydrocephalus.    Included Orbits: Negative Sinuses:  Predominantly clear Bones: No acute findings Soft tissues:  Negative     IMPRESSION: 1.  Subacute versus chronic left frontal lobe infarction. Difficult to delineate without comparison exam. This could be confirmed with MRI, as clinically indicated. 2.  Otherwise, no acute intracranial process 3.  Chronic microvascular changes Electronically signed by: Cecilio Asper, MD 09/07/2021 8:58 PM EDT        Rosalene Billings, MD  September 07, 2021

## 2021-09-07 NOTE — ED Notes (Addendum)
Assumed care of pt   Pt arrived to room via stretcher   Introduced self to pt   Updated white board and instructed about usage  Pt alert and oriented to self and location only   Pt has hx of dementia   Pt family stated per EMS was more confused then usual   Family took blood pressure and was elevated   Called 911 per EMS pt was at baseline when arrive   Per EMS -[place] score   Pt unable to given info about medication   -dizziness   -faint   -SOB   +confused to time   +confused to situation   +right side weakness   +right side blindness   -diarhhea   -constipated  -issues with urinating   Pt has hx of CVA with right side weakness   Repositioned pt and tidied room   Updated about plan of care   Pt understood and agreed  Pt bed in lowest locked position   Safety checks performed and SR up x 2   Educated pt about using call bell to get out of bed   Pt has no evidence of learning   No further needs at this time   See interactive flowsheets for assessment and care

## 2021-09-08 ENCOUNTER — Observation Stay: Admit: 2021-09-08 | Payer: MEDICARE | Primary: Nurse Practitioner

## 2021-09-08 LAB — COMPREHENSIVE METABOLIC PANEL
ALT: 16 U/L (ref 10–49)
AST: 17 U/L (ref 0.0–33.9)
Albumin: 3.5 gm/dl (ref 3.4–5.0)
Alkaline Phosphatase: 169 U/L — ABNORMAL HIGH (ref 46–116)
Anion Gap: 8 mmol/L (ref 5–15)
BUN: 14 mg/dl (ref 9–23)
CO2: 21 mEq/L (ref 20–31)
Calcium: 9.5 mg/dl (ref 8.7–10.4)
Chloride: 114 mEq/L — ABNORMAL HIGH (ref 98–107)
Creatinine: 0.74 mg/dl (ref 0.55–1.02)
EGFR IF NonAfrican American: >60
GFR African American: >60
Glucose: 80 mg/dl (ref 74–106)
Potassium: 4 mEq/L (ref 3.5–5.1)
Sodium: 143 mEq/L (ref 136–145)
Total Bilirubin: 0.7 mg/dl (ref 0.30–1.20)
Total Protein: 6.7 gm/dl (ref 5.7–8.2)

## 2021-09-08 LAB — URINALYSIS W/ RFLX MICROSCOPIC
Bilirubin, Urine: NEGATIVE
Bilirubin: NEGATIVE
Blood, Urine: NEGATIVE
Blood: NEGATIVE
Glucose, Ur: NEGATIVE mg/dl
Glucose: NEGATIVE mg/dl
Ketone: NEGATIVE mg/dl
Ketones, Urine: NEGATIVE mg/dl
Leukocyte Esterase, Urine: NEGATIVE
Leukocyte Esterase: NEGATIVE
Nitrite, Urine: NEGATIVE
Nitrites: NEGATIVE
Protein, UA: NEGATIVE mg/dl
Protein: NEGATIVE mg/dl
Specific Gravity, UA: <=1.005 (ref 1.005–1.030)
Specific gravity: <=1.005 (ref 1.005–1.030)
Urobilinogen, UA, POCT: 0.2 mg/dl (ref 0.0–1.0)
Urobilinogen: 0.2 mg/dl (ref 0.0–1.0)
pH (UA): 6.5 (ref 5.0–9.0)
pH, UA: 6.5 (ref 5.0–9.0)

## 2021-09-08 LAB — EKG 12-LEAD
Atrial Rate: 51 {beats}/min
P Axis: 30 degrees
P-R Interval: 172 ms
Q-T Interval: 484 ms
QRS Duration: 80 ms
QTc Calculation (Bazett): 446 ms
R Axis: -6 degrees
T Axis: 26 degrees
Ventricular Rate: 51 {beats}/min

## 2021-09-08 LAB — HEMOGLOBIN A1C W/O EAG
Hemoglobin A1C: 5.1 % (ref 3.8–5.6)
Hemoglobin A1c: 5.1 % (ref 3.8–5.6)

## 2021-09-08 LAB — POC URINE MACROSCOPIC
Bilirubin, Urine: NEGATIVE
Bilirubin: NEGATIVE
Blood, Urine: NEGATIVE
Blood: NEGATIVE
Glucose, Ur: NEGATIVE mg/dl
Glucose: NEGATIVE mg/dl
Ketone: NEGATIVE mg/dl
Ketones, Urine: NEGATIVE mg/dl
Leukocyte Esterase, Urine: NEGATIVE
Leukocyte Esterase: NEGATIVE
Nitrite, Urine: NEGATIVE
Nitrites: NEGATIVE
Protein, UA: NEGATIVE mg/dl
Protein: NEGATIVE mg/dl
Specific Gravity, UA: <=1.005 (ref 1.005–1.030)
Specific gravity: <=1.005 (ref 1.005–1.030)
Urobilinogen, UA, POCT: 0.2 EU/dl (ref 0.0–1.0)
Urobilinogen: 0.2 EU/dl (ref 0.0–1.0)
pH (UA): 6.5 (ref 5–9)
pH, UA: 6.5 (ref 5–9)

## 2021-09-08 LAB — ECHOCARDIOGRAM 2D W DOPPLER W COLOR W CONTRAST: Left Ventricular Ejection Fraction: 55

## 2021-09-08 LAB — LIPID PANEL
CHOL/HDL Ratio: 4 {ratio} (ref 0.0–4.4)
Chol/HDL Ratio: 4 Ratio (ref 0.0–4.4)
Cholesterol, Total: 159 mg/dl (ref 0–199)
Cholesterol, total: 159 mg/dL (ref 0–199)
HDL Cholesterol: 40 mg/dL (ref 40–60)
HDL: 40 mg/dl (ref 40–60)
LDL Calculated: 105 mg/dl (ref 0–130)
LDL, calculated: 105 mg/dL (ref 0–130)
Triglyceride: 69 mg/dL (ref 0–150)
Triglycerides: 69 mg/dl (ref 0–150)

## 2021-09-08 LAB — TROPONIN, HIGH SENSITIVITY: Troponin, High Sensitivity: 9 ng/L (ref 0–34)

## 2021-09-08 LAB — POCT GLUCOSE: POC Glucose: 71 mg/dL (ref 65–105)

## 2021-09-08 LAB — LACTIC ACID
LACTIC ACID: 0.8 mmol/L (ref 0.5–2.2)
Lactic Acid: 0.8 mmol/L (ref 0.5–2.2)

## 2021-09-08 LAB — EKG, 12 LEAD, INITIAL
Atrial Rate: 51 {beats}/min
Calculated P Axis: 30 degrees
Calculated R Axis: -6 degrees
Calculated T Axis: 26 degrees
P-R Interval: 172 ms
Q-T Interval: 484 ms
QRS Duration: 80 ms
QTC Calculation (Bezet): 446 ms
Ventricular Rate: 51 {beats}/min

## 2021-09-08 LAB — METABOLIC PANEL, COMPREHENSIVE
ALT (SGPT): 16 U/L (ref 10–49)
AST (SGOT): 17 U/L (ref 0.0–33.9)
Albumin: 3.5 gm/dl (ref 3.4–5.0)
Alk. phosphatase: 169 U/L — ABNORMAL HIGH (ref 46–116)
Anion gap: 8 mmol/L (ref 5–15)
BUN: 14 mg/dl (ref 9–23)
Bilirubin, total: 0.7 mg/dl (ref 0.30–1.20)
CO2: 21 mEq/L (ref 20–31)
Calcium: 9.5 mg/dl (ref 8.7–10.4)
Chloride: 114 mEq/L — ABNORMAL HIGH (ref 98–107)
Creatinine: 0.74 mg/dl (ref 0.55–1.02)
GFR est AA: >60
GFR est non-AA: >60
Glucose: 80 mg/dl (ref 74–106)
Potassium: 4 mEq/L (ref 3.5–5.1)
Protein, total: 6.7 gm/dl (ref 5.7–8.2)
Sodium: 143 mEq/L (ref 136–145)

## 2021-09-08 LAB — TROPONIN-HIGH SENSITIVITY: Troponin-High Sensitivity: 9 ng/L (ref 0–34)

## 2021-09-08 LAB — GLUCOSE, POC: Glucose (POC): 71 mg/dL (ref 65–105)

## 2021-09-08 MED ORDER — ASPIRIN 81 MG CHEWABLE TAB
81 mg | Freq: Every day | ORAL | Status: AC
Start: 2021-09-08 — End: 2021-09-09
  Administered 2021-09-08 – 2021-09-09 (×2): via ORAL

## 2021-09-08 MED ORDER — SODIUM CHLORIDE 0.9 % INJECTION
1.1 mg/mL | INTRAMUSCULAR | Status: AC | PRN
Start: 2021-09-08 — End: 2021-09-08
  Administered 2021-09-08: 14:00:00 via INTRAVENOUS

## 2021-09-08 MED ORDER — HEPARIN (PORCINE) 5,000 UNIT/ML IJ SOLN
5000 unit/mL | Freq: Two times a day (BID) | INTRAMUSCULAR | Status: AC
Start: 2021-09-08 — End: 2021-09-09
  Administered 2021-09-08 (×2): via SUBCUTANEOUS

## 2021-09-08 MED ORDER — SODIUM CHLORIDE 0.9 % IJ SYRG
INTRAMUSCULAR | Status: AC | PRN
Start: 2021-09-08 — End: 2021-09-09

## 2021-09-08 MED ORDER — ACETAMINOPHEN 325 MG TABLET
325 mg | Freq: Four times a day (QID) | ORAL | Status: DC | PRN
Start: 2021-09-08 — End: 2021-09-09

## 2021-09-08 MED ORDER — SODIUM CHLORIDE 0.9 % IJ SYRG
INTRAMUSCULAR | Status: DC | PRN
Start: 2021-09-08 — End: 2021-09-09
  Administered 2021-09-08: 14:00:00 via INTRAVENOUS

## 2021-09-08 MED ORDER — ATORVASTATIN 40 MG TAB
40 mg | Freq: Every evening | ORAL | Status: AC
Start: 2021-09-08 — End: 2021-09-09
  Administered 2021-09-09: 04:00:00 via ORAL

## 2021-09-08 MED ORDER — SODIUM CHLORIDE 0.9 % IJ SYRG
Freq: Three times a day (TID) | INTRAMUSCULAR | Status: DC
Start: 2021-09-08 — End: 2021-09-09
  Administered 2021-09-08 – 2021-09-09 (×4): via INTRAVENOUS

## 2021-09-08 MED ORDER — NALOXONE 0.4 MG/ML INJECTION
0.4 mg/mL | INTRAMUSCULAR | Status: AC | PRN
Start: 2021-09-08 — End: 2021-09-09

## 2021-09-08 MED FILL — SODIUM CHLORIDE 0.9 % IJ SYRG: INTRAMUSCULAR | Qty: 10

## 2021-09-08 MED FILL — HEPARIN (PORCINE) 5,000 UNIT/ML IJ SOLN: 5000 unit/mL | INTRAMUSCULAR | Qty: 1

## 2021-09-08 MED FILL — ASPIRIN 81 MG CHEWABLE TAB: 81 mg | ORAL | Qty: 2

## 2021-09-08 MED FILL — DEFINITY 1.1 MG/ML INTRAVENOUS SUSPENSION: 1.1 mg/mL | INTRAVENOUS | Qty: 1.3

## 2021-09-08 NOTE — Progress Notes (Signed)
Echo with Definity contrast completed.

## 2021-09-08 NOTE — Progress Notes (Signed)
PAGER ID: [phone]  MESSAGE: [id number]. PT Amy Sharp. DX CVA. Patient has a history of dementia and family is concerned she is not getting her home diamox, aricept and namenda making her more confused. Please do you want [place] to start these home meds? Safo, RN [phone]

## 2021-09-08 NOTE — Progress Notes (Signed)
Patient admitted on 09/07/2021 from ED FROM HOME WHERE SHE RESIDES WITH SON with   Chief Complaint   Patient presents with    Hypertension    Altered mental status          The patient is being treated for    PMH:   Past Medical History:   Diagnosis Date    Dementia (HCC)     Hypertension     Stroke [facility])         Treatment Team: Treatment Team: Attending Provider: Rosemary Holms, MD; Consulting Provider: Rosemary Holms, MD; Primary Nurse: Lawerance Cruel, RN      The patient has been admitted to the hospital 1 times in the past 12 months.    Previous 4 Admission Dates Admission and Discharge Diagnosis Interventions Barriers Disposition                                 Patient and Family/Caregivers Goals of Care: TX AND FEEL BETTER    Caregivers Participating in Plan of Care/Discharge Plan with the patient: SON    Tentative dc plan: HOME TBD BASED ON PT OT     Anticipated DME needs for discharge: TBD    PRESCREENING COMPLETED FOR SNF M  Will patient qualify for medicaid now or in the next 180 days? M      Has patient had covid vaccine? Y  Date and type if not in chart under immunization field YES X4    Does patient have an ACP?N         Does the patient have appropriate clothing available to be worn at discharge? YES      The patient and care participants are willing to travel NA area for discharge facility.  The patient and plan of care participants have been provided with a list of all available Rehab Facilities or Home Health agencies as applicable. CM will follow up with a list of facilities or agencies that are offer acceptance.    CM has disclosed any financial interest that [facility] may have with any facility or agency.    Anticipated Discharge Date: 1 DAY      Barriers to Healthcare Success/ Readmission Risk Factors: CVA    Consults:  Palliative Care Consult Recommended: N  Transitional Care Clinic Referral: N  Transitional Nurse Navigator Referral: N  Oncology Navigator Referral: N  SW consulted: N  [facility]  ([facility] Consulted: N  Outside [employer] Referrals and Collaboration: N    Food/Nutrition Needs:   N                   Dietician Consulted: N    RRAT Score: Low Risk            10 Total Score    5 Pt. Coverage (Medicare=5 , Medicaid, or Self-Pay=4)    5 Charlson Comorbidity Score (Age + Comorbid Conditions)        Criteria that do not apply:    Has Seen PCP in Last 6 Months (Yes=3, No=0)    Married. Living with Significant Other. Assisted Living. LTAC. SNF. or   Rehab    Patient Length of Stay (>5 days = 3)    IP Visits Last 12 Months (1-3=4, 4=9, >4=11)           PCP: Rosalie Doctor, NP . How do you get to your doctor appointment SON    Specialists:  Dialysis Unit:      Pharmacy:   name [facility] OUTPT [facility] FOR NOW, PT JUST MOVED   Are there any medications that you have trouble paying forN  Any difficulty getting your medicationN    DME available at Home:CANE AND W/C FOR LONG DISTANCES    Home O2 L Flow:  N            Home O2 Provider: N    Home Environment and Prior Level of Function: Lives at [address]  [place] [address] @HOMEPHONE @. Lives with SON   How many stories is home 1   Steps into home1  Responsibilities at home include ADLS    Prior to admission open services: N    Home Health AgencyN  Personal Care AgencyN    Extended Emergency Contact Information  Primary Emergency Contact: Between  Home Phone: [phone]  Mobile Phone: [phone]  Relation: Son  Secondary Emergency Contact: [facility]  Home Phone: [phone]  Mobile Phone: [phone]  Relation: Spouse     Transportation: SON will transport home    Therapy Recommendations:    OT = Y    PT = Y    SLP =  Y     RT Home O2 Evaluation =  N    Wound Care =  N    Case Management Assessment    ABUSE/NEGLECT SCREENING   Physical Abuse/Neglect: Denies   Sexual Abuse: Denies   Sexual Abuse: Denies   Other Abuse/Issues: Denies          PRIMARY DECISION MAKER                                    CARE MANAGEMENT INTERVENTIONS   Readmission Interview Completed: Not Applicable   PCP Verified by CM: Yes           Mode of Transport at Discharge: Other (see comment) (son)       Transition of Care Consult (CM Consult): Discharge Planning           MyChart Signup: No   Discharge Durable Medical Equipment: No   Physical Therapy Consult: Yes   Occupational Therapy Consult: Yes   Speech Therapy Consult: Yes       Reason for Referral: DCP Rounds   History Provided By: Medical Record, Child/Family   Patient Orientation: Alert and Oriented   Cognition: Short Term Memory Deficit   Support System Response: Concerned   Previous Living Arrangement: Lives with Family Independent   Home Accessibility: Steps   Prior Functional Level: Independent in ADLs/IADLs   Current Functional Level: Independent in ADLs/IADLs       Can patient return to prior living arrangement: Yes   Ability to make needs known:: Good   Family able to assist with home care needs:: Yes               Types of Needs Identified: Treatment Education       Confirm Follow Up Transport: Family                  DISCHARGE LOCATION

## 2021-09-08 NOTE — Progress Notes (Signed)
INTERNAL MEDICINE                                                                   Daily Progress Note    Patient:  Amy Sharp  Today date: September 08, 2021  Date of Admission:  09/07/2021    Interval History and Events of the last 24 hours:  Altered mental status with right-sided weakness    Assessment and Plan:       Patient seen in follow up for multiple medical problems as listed below :  Subacute versus chronic left frontal lobe infarction  Hx of  lacunar infarct in the left corona radiata to lentiform in 03/2021  Cont ASA, high dose statin   Neuro check  MRI brian   PT/OT/ST   HH recommended  Permissive HTN first 48 hrs     DM2   Diabetes diet   Check hemoglobin A1c  Actos was  hold since April of this year    Hypertension uncontrolled  Permissive HTN initially.     HLD  Cont high intensity statin        Dementia  Mental status at baseline  Continue Aricept    Sinus bradycardia        DVT Prophylaxis:    Continue home regimen for otherwise chronic, stable medical conditions as noted above. Discussed with patient and or family regarding management, prognosis, treatment and complications of the patient's medical conditions in detail. All questions answered to the satisfaction of those individual(s) who also verbalized understanding of and agreement with the assessment and plan.     Code Status:  .Full Code     Disposition and Family:   Recommend to continue hospitalization. Discussed with patient and nursing staff.    Anticipated Date of Discharge: TBD  Anticipated Disposition (home, SNF) : TBD      ROS     As H&P and Above  Labwork and Ancillary Studies     All labs/tests/imaging reviewed.Spoke with the nurse regarding patient issues.    Labwork:    CBC w/Diff   Lab Results   Component Value Date/Time    WBC 6.9 09/07/2021 07:50 PM    HGB 12.0 (L) 09/07/2021 07:50 PM    HCT 38.1 09/07/2021 07:50 PM    PLATELET 243 09/07/2021 07:50 PM    MCV 100.5  (H) 09/07/2021 07:50 PM        Basic Metabolic Profile   Lab Results   Component Value Date/Time    Sodium 143 09/07/2021 07:50 PM    Potassium 4.0 09/07/2021 07:50 PM    Chloride 114 (H) 09/07/2021 07:50 PM    CO2 21 09/07/2021 07:50 PM    Anion gap 8 09/07/2021 07:50 PM    Glucose 80 09/07/2021 07:50 PM    BUN 14 09/07/2021 07:50 PM    Creatinine 0.74 09/07/2021 07:50 PM    GFR est AA >60.0 09/07/2021 07:50 PM    GFR est non-AA >60 09/07/2021 07:50 PM    Calcium 9.5 09/07/2021 07:50 PM        Cardiac Enzymes   No results found for: CPK, RCK1, RCK2, RCK3, RCK4, CKNDX, CKND1, TROPT, TROIQ, BNPP, BNP   Arterial Blood Gases   No results found for: PH, PHI, PCO2,  PCO2I, PO2, PO2I, HCO3, HCO3I, FIO2, FIO2I   Coagulation    No results found for: APTT, PTP, INR, INREXT   Hepatic Function   Lab Results   Component Value Date/Time    Alk. phosphatase 169 (H) 09/07/2021 07:50 PM            XR Results (most recent):  Results from Hospital Encounter encounter on 09/07/21    XR CHEST SNGL V    Narrative  XR CHEST SNGL V    Clinical indication: AMS.    Comparison: None    Support devices: None    Lungs: Lungs are clear. No pleural effusions. No pneumothorax.    Cardiomediastinal: Normal morphology for radiographic technique.    Bones/soft tissues: No acute abnormalities. Screw anchor in the left humeral  head noted.    Impression  IMPRESSION:    No acute cardiopulmonary process.    Electronically signed by: Cecilio Asper, MD 09/07/2021 9:22 PM EDT      MRI Results (most recent):  No results found for this or any previous visit.      CT Results (most recent):  Results from Hospital Encounter encounter on 09/07/21    CT HEAD WO CONT    Narrative  HEAD CT WITHOUT CONTRAST    INDICATION: AMS.    COMPARISON: No relevant studies.    TECHNIQUE: Transaxial images were obtained from the skull base to the vertex  without intravenous contrast with coronal and sagittal reformatted images.    All CT exams at this facility use one or more  dose reduction techniques  including automatic exposure control, mA/kV adjustment per patient's size, or  iterative reconstruction technique.    FINDINGS:    Scout image: Negative.    Parenchyma: Hypoattenuation along the left frontal lobe left ACA vascular  territory compatible with subacute or chronic infarction. Small chronic lacunar  infarct left basal ganglia. Remainder of the gray-white matter differentiation  is preserved. Age-related generalized cerebral volume loss. Empty sella  morphology.    Extra-axial Collection:  None    Ventricles: No hydrocephalus.    Included Orbits: Negative    Sinuses:  Predominantly clear    Bones: No acute findings    Soft tissues:  Negative    Impression  IMPRESSION:    1.  Subacute versus chronic left frontal lobe infarction. Difficult to delineate  without comparison exam. This could be confirmed with MRI, as clinically  indicated.  2.  Otherwise, no acute intracranial process  3.  Chronic microvascular changes      Electronically signed by: Cecilio Asper, MD 09/07/2021 8:58 PM EDT        Current Inpatient Meds and Allergies     Medications:  Current Facility-Administered Medications   Medication Dose Route Frequency    sodium chloride (NS) flush 5-10 mL  5-10 mL IntraVENous Q8H    sodium chloride (NS) flush 5-10 mL  5-10 mL IntraVENous PRN    naloxone (NARCAN) injection 0.1 mg  0.1 mg IntraVENous PRN    acetaminophen (TYLENOL) tablet 650 mg  650 mg Oral Q6H PRN    heparin (porcine) injection 5,000 Units  5,000 Units SubCUTAneous Q12H          Objective:       Visit Vitals  BP (!) 161/69 (BP 1 Location: Left upper arm, BP Patient Position: Sitting)   Pulse (!) 48   Temp 97.5 ??F (36.4 ??C)   Resp 17   Ht 5' 6" (1.676 m)   Wt 75.3  kg (166 lb)   SpO2 99%   Breastfeeding No   BMI 26.79 kg/m??     Body mass index is 26.79 kg/m??.    No intake or output data in the 24 hours ending 09/08/21 0845    General:   Alert, cooperative, no distress, appears stated age.    Lungs:    Clear, no   wheeze, rhonchi, rales   Chest wall:   No tenderness or deformity.    Heart:   Regular rate and rhythm, S1, S2 normal, no murmur, click, rub or gallop.    Abdomen:    Soft, non-tender. Bowel sounds normal. No masses,  No organomegaly.        Musculoskeleta : Normal range of motion in most of the joints   Extremities:  Extremities normal, atraumatic, no cyanosis or edema.    Pulses:  2+ and symmetric all extremities.    Skin:  Skin color, texture, turgor normal. No rashes or lesions    Neurologic  Psych:  : CNII-XII intact.  Right-sided weaknes   Normal affect and mood . No thoughts of harm to self or others              Total time spent with chart review, patient examination/education, discussion with staff on case,documentation and medication management / adjustment:   >35 minutes    It is always a pleasure to be involved in the clinical care of this patient.    Insurance risk surveyor medical dictation software was used for portions of this report.  Unintended voice transcription errors may have occurred.)    Otila Kluver, MD  September 08, 2021  [facility]  Pager:  [phone]

## 2021-09-08 NOTE — Progress Notes (Signed)
 SPEECH LANGUAGE PATHOLOGY   BEDSIDE DYSPHAGIA EVALUATION & DISCHARGE     Patient: Amy Sharp (85 y.o. female), [birth year]  Room: 6618/6618  Primary Diagnosis: AMS (altered mental status) [R41.82]  Stroke [facility]) [I63.9]        PMH:   Past Medical History:   Diagnosis Date    Dementia (HCC)     Hypertension     Stroke (HCC)      Isolation:  There are currently no Active Isolations       MDRO: No current active infections  PPE: surgical mask, gloves  Precautions:  universal    Date: 09/08/2021   Start Time:  1018 End Time:  1028     ASSESSMENT:   Orientation: Pt alert and awake, oriented to name and location only    Respiratory Status: RA  Evaluation: Pt referred to ST as a part of CVA workup. Pt presents with oropharyngeal swallow skills grossly WFL in setting of CVA rule out  without overt s/sx of aspiration.  Pt upright in bed feeding herself breakfast consisting of regular and soft solids with thin liquids. OME unremarkable. Bolus manipulation and ap transport functional.  +Hyolaryngeal elevation upon palpation. After taking serial straw sips of thin liquids pt tolerated. Per nursing, pt took pills whole with morning without any difficulty. CXR upon admission revealed no acute cardiopulmonary process. Per chart review, pt is oriented x1 at baseline d/t hx of dementia. She participates in conversation appropriately with SLP. No further evaluation indicated. Skilled ST services not indicated at this time.    Functional Oral Intake Scale: Level 7: Total oral intake without restrictions     PLAN/ RECOMMENDATIONS:                 Diet Recommendations: continue IDDSI 7- regular, IDDSI 0- thin   Compensatory Swallow Strategies: HOB ~60* for all po feeds and >45* at least 30 minutes following, small bites/ sip   Medication Administration: as tolerated   Aspiration Precautions: may feed self independently  Risk(s) for Aspiration: advanced age , mental ability/ status     Therapy Recommendations: Skilled ST services not  indicated at this time.       Discharge Recommendations: no follow up indicated  Other Recommended Services: OT, PT     SUBJECTIVE:    Patient Stated: Not Ms. Nialah, just call me Deyona.   Pain Prior to Treatment: no pain reported  Pain Following Treatment: no pain reported     Diet Prior to Admission: IDDSI 7- regular, IDDSI 0- thin  Current Diet: IDDSI 7- regular, IDDSI 0- thin     OBJECTIVE:   Position: upright in bed  Oral Motor Assessment: oromotor skills WFL  Dentition: natural   Voice:  WFL  Consistencies: thin single sips, serial sips, and via straw, regular and soft/ bite sized (IDDSI 6)  Oral Phase: WFL  Pharyngeal Phase: WFL  Esophageal Phase: n/a    Safety: Following session, pt in b/s chair and tray table, phone, and call bell within reach   Modified Rankin Score (MRS): Defer to OT/ PT    EDUCATION:   Education Provided: role of SLP, POC, results of evaluation, and diet recommendations  Individual Educated: patient  Comprehension: query pt's level carry over and no family present   Staff Education: Educated Nurse to POC.      IMAGING:   CT HEAD 09/07/2020:   1.  Subacute versus chronic left frontal lobe infarction. Difficult to delineate  without comparison exam. This could be  confirmed with MRI, as clinically  indicated.  2.  Otherwise, no acute intracranial process  3.  Chronic microvascular changes       Thank you for this referral,  Renaldo Dean, M.S. CCC-SLP  Speech Language Pathologist  Ext [phone] or Secure Chat

## 2021-09-09 MED ORDER — AMLODIPINE 5 MG TAB
5 mg | Freq: Every day | ORAL | Status: DC
Start: 2021-09-09 — End: 2021-09-09
  Administered 2021-09-09: 14:00:00 via ORAL

## 2021-09-09 MED ORDER — ACETAZOLAMIDE 250 MG TAB
250 mg | Freq: Two times a day (BID) | ORAL | Status: AC
Start: 2021-09-09 — End: 2021-09-09
  Administered 2021-09-09 (×2): via ORAL

## 2021-09-09 MED ORDER — DONEPEZIL 10 MG TAB
10 mg | Freq: Every evening | ORAL | Status: AC
Start: 2021-09-09 — End: 2021-09-09
  Administered 2021-09-09: 04:00:00 via ORAL

## 2021-09-09 MED ORDER — MEMANTINE 7 MG SPRINKLE ER 24HR CAPSULE
7 mg | Freq: Every day | ORAL | Status: AC
Start: 2021-09-09 — End: 2021-09-09
  Administered 2021-09-09: 04:00:00 via ORAL

## 2021-09-09 MED ORDER — AMLODIPINE 5 MG TAB
5 mg | ORAL_TABLET | Freq: Every day | ORAL | 0 refills | Status: AC
Start: 2021-09-09 — End: ?

## 2021-09-09 MED FILL — ACETAZOLAMIDE 250 MG TAB: 250 mg | ORAL | Qty: 1

## 2021-09-09 MED FILL — AMLODIPINE 5 MG TAB: 5 mg | ORAL | Qty: 1

## 2021-09-09 MED FILL — HEPARIN (PORCINE) 5,000 UNIT/ML IJ SOLN: 5000 unit/mL | INTRAMUSCULAR | Qty: 1

## 2021-09-09 MED FILL — ASPIRIN 81 MG CHEWABLE TAB: 81 mg | ORAL | Qty: 2

## 2021-09-09 MED FILL — MEMANTINE 7 MG SPRINKLE ER 24HR CAPSULE: 7 mg | ORAL | Qty: 1

## 2021-09-09 MED FILL — ATORVASTATIN 40 MG TAB: 40 mg | ORAL | Qty: 1

## 2021-09-09 MED FILL — DONEPEZIL 10 MG TAB: 10 mg | ORAL | Qty: 1

## 2021-09-09 NOTE — Progress Notes (Signed)
 OCCUPATIONAL THERAPY EVALUATION     Patient: Amy Sharp (85 y.o. female)  Room: 6618/6618  Primary Diagnosis: AMS (altered mental status) [R41.82]  Stroke [facility]) [I63.9]         Date: 09/09/2021  In time:  0857        Out time:  0935  Total Time: 38 minutes  Evaluation Time: 13 minutes  Treatment Time: 25 minutes    Isolation:  There are currently no Active Isolations       MDRO: No active infections  Precautions: Confused, Falls, and Baseline Dementia .  ADULT DIET Regular; Low Fat/Low Chol/High Fiber/2 gm Na  Weight bearing precautions: None    Orders, labs, and chart reviewed. Communicated with nursing staff. Patient cleared to participate.    ASSESSMENT:      Agreeable to OT evaluation this date. Seen with PT for increased patient/therapist safety with OT focus on ADL's, and PT focus on mobility. Requires frequent redirection to task. Very pleasant and motivated to work with therapy to return to PLOF.    Based on the objective data described below, the patient presents with   - generalized muscle weakness affecting function in ADLs  - decreased functional sitting and standing balance    - decreased functional mobility   - unsteady in functional mobility, transfers, standing, and sitting   - decreased standing tolerance  - decreased tolerance to sustained activity  - deconditioning  - decreased cognition   - decreased safety awareness affecting patient's ability to safely and independently perform basic ADLs/IADLs.    Patient will benefit from skilled occupational therapy intervention to address the above impairments.    Patient's rehabilitation potential is considered to be Good.     Modified Rankin Score (mRS): 3 - Moderate disability; requires some help but able to walk without (human) assistance - cane, walker etc.    PLAN :  Planned Interventions: Adaptive equipment, ADI training, activity tolerance, functional balance training, functional mobility training, therapeutic exercise, therapeutic activity,  patient/caregiver education and training, home exercise program, and energy conservation.  Frequency/Duration: Patient to be seen 1-5x/week x 2 weeks.  Discharge Recommendations:  SNF verses Home with family support and HH OT/PT , pending progress, Would benefit to improve independence in ADLs, strength, activity tolerance and balance to ensure a successful and sustainable return to home. , 24 hour supervision  Further Equipment Recommendations for Discharge: rolling walker     Patient and/or family have participated as able in goal setting and plan of care.    PRIOR LEVEL OF FUNCTION:     Information was obtained by:  patient is a poor historian due to baseline dementia; information was obtained from chart review  Home environment: Patient lives with son in a 1 story house     Prior level of function:Patient requires assistance with basic ADLs.  Patient ambulates with cane at home and wheelchair in community.  Prior level of Instrumental Activities of Daily Living: Patient does not perform any IADLs.  Home equipment: cane, wheelchair    EDUCATION:     Barriers to Learning/Limitations: yes;  cognitive: baseline dementia  Education provided: Patient, Role of OT, Benefit of activity while hospitalized, Call for assistance, Staff assistance with mobility, Changes positions frequently, ADL training, Adaptive equipment use, Bathroom safety, Home safety, Energy conservation, Safety, Functional mobility, Demonstrates adequately, Verbalized understanding, Reinforce needed, Teaching method, and Verbal.  Educational Handouts issued: NONE.    SUBJECTIVE:   Patient reported, You girls made my morning. I wish you could  stay all day!   --------------------------------------------------------------------------------------------------  Pain Assessment: None observed and None reported  Pain Location:  N/A    OBJECTIVE DATA SUMMARY:     Patient found Bed, Telemetry, IV, Bed alarm, and Sitter present .    Cognition     Mental  Status: Oriented to, person, place, confused, and pleasant.  Communication: grossly intact and baseline dementia .  Attention span: good(>63min).  Follows commands: follows simple 1 step commands, tactile cues, verbal cues, visual cues, inconsistent, and requires frequent redirection to task.  General Cognition: impaired sequencing, slow processing, impaired safety, impaired problem solving, impaired ST Memory which may impact retention of education  Hearing: impaired.  Vision:  glasses.      Activities of Daily Living    Eating: standby assist, verbal cues, set-up, and increased time.  Grooming: standby assist, verbal cues, set-up, increased time, and standing level.  UB Bathing: standby assist, verbal cues, set-up, and increased time.  LB Bathing: contact guard, verbal cues, set-up, and increased time.  UB Dressing: min. assist, verbal cues, set-up, increased time, EOB level, and robe.  LB Dressing: contact guard, verbal cues, set-up, increased time, EOB level, cross leg technique, and socks  Toileting: contact guard, verbal cues, set-up, increased time, and commode.    ADL Comment:  based on clinical assessment and direct observation of patient this date. Verbal cues for safety and sequencing and  technique. Frequent redirection to task due to decreased ST memory. Able to don/doff BLE socks while seated EOB with good use of figure four technique. Able to wash face and apply deodorant to B underarms while standing at sink. Able to don hospital gown like a robe with min A. CGA for toileting hygiene with vc's for use of grab bars to increase safety.     Mobility    Rolling: supervision, verbal cues, set-up, increased time, and rail.  Supine to sit: standby assist, verbal cues, set-up, increased time, and rail.  Sit to Supine: standby assist, verbal cues, set-up, increased time, and rail.  Sit to Stand: contact guard, verbal cues, set-up, increased time, 1 person, and assistive devices: RW and gait belt .  Stand to  Sit: contact guard, verbal cues, set-up, increased time, 1 person, and assistive devices: RW and gait belt.  Functional transfers: bed to commode with  contact guard, min. assist, verbal cues, visual cues, set-up, increased time, and assistive device : RW and gait belt and hand held assist .    Mobility Comment: Verbal cues for safety and sequencing and technique. Frequent redirection to task due to decreased ST memory. Education for safe walker management and hand placement. Vc's for use of grab bars and bed rails. Ambulated in hallway with CGA/min A, verbal cues, safety cues, technique, increased time, and RW and gait belt.     Functional Balance    Static Sitting Balance: good.  Dynamic Sitting Balance: fair.  Static Standing Balance: fair.  Dynamic Standing Balance: fair-.  Activity Tolerance: requires rest breaks.    Balance Comment: Verbal cues for safety and sequencing. Frequent redirection to task due to decreased ST memory. Able to reach outside of BOS with CGA. Requires RW for increased BUE support and balance while standing/ambulating. Generalized fatigue. Decreased activity tolerance due to deconditioning. Vitals stable. No apparent distress.     Upper Extremity Function    Right ROM/strength:  AROM, WFL, and 4/5 shoulder flexion, 4+/5 biceps, 4-/5 triceps .  Left ROM/strength: AROM, WFL, and 4/5 shoulder flexion, 4+/5 biceps, 4-/5  triceps.  Right UE: no c/o paresthesia.  Left UE : no c/o paresthesia.  Dominance: right  Affected extremity: none    UE Function comments: WFL's for BUE's ROM and strength.     Final Location: bed, bed alarm, all needs close, agrees to call for assistance, nurse notified , and sitter present .       Micky Index of Independence in Activities of Daily Living    Activities  Points (1 or 0) Independence  (1 point)  NO supervision, direction, or personal assistance Dependence   (0 points)  WITH supervision, direction, personal assistance, or total care   Bathing []  (1 POINT) Bathes  self completely or needs help in bathing only a single part of the body such as the back, genital area, or disabled extremity [x]  (0 POINTS) Need help with bathing more than one part of the body, getting in or out of the tub or showering. Requires total bathing.    Dressing []  (1 POINT) Get clothes from  closets and drawers and puts on clothes and outer garments complete with fasteners. May have help tying shoes.  [x]  (0 POINTS) Needs help with dressing self or needs to be completely dressed.    Toileting []  (1 POINT) Goes to toileting, gets on and off, arranges clothes, cleans genital area without help  [x]  Needs help transferring to the toileting, cleaning self or uses bedpan or commode    Transferring []  (1 POINT) Moves in and out of the bed or chair unassisted. Mechanical transfer aids are acceptable  [x] (0 POINTS) Needs help in moving from bed to chair or requires a complete transfer   Continence  []  (1 POINT) Exercises complete self control over urination and defecation  [x]  (0 POINTS) Is partially or totally incontinent of bowel or bladder   Feeding  [x]  (1 POINT) Gets food from plate into mouth without help. Preparation of food may be done by another person []  (0 POINTS) Needs partial or total help with feeding or requires parenteral feeding.     Total Score = 1             Scoring: 6 = High (patient independent) 0 = Low (patient very dependent)      By Hoy Shutter, PhD, APRN, BC, [facility], and Ronal Corns, PhD, ARNP, [facility]    Occupational Therapy Goals:   OT goals initiated 09/09/2021 and will be met by patient within 10 sessions     Patient will complete UB dressing/bathing with supervision to increase I in ADLs.  Patient will complete sit to stand transfer with supervision to increase I in functional transfers.  Patient will complete LB dressing/bathing with supervision to improve ADL function.  Patient will stand at sink x 15 minutes with fair+  balance to complete grooming tasks.  Patient will complete toileting tasks with supervision to improve ADL function.   _______________________________________________________________________    Thank you for this referral.    Lyle HERO Cardwell, OTD, OTR/L

## 2021-09-09 NOTE — Progress Notes (Signed)
Discharge Plan:   [facility] PT OT RN [facility]    Discharge Date:     09/09/2021     Home Health Needed:     Y    Home Health Agency:    [facility] PT OT RN    FOC given:Y    Confirmed start of care with the home health agency and spoke with:      EMAIL REFERRAL SENT    DME needed and ordered for Discharge:   Therese Sarah    DME Company:    [employer]    CM confirmed delivery of DME: with      I delivered    Transportation: Family

## 2021-09-09 NOTE — Progress Notes (Signed)
 Progress Notes by Lavella Nat BROCKS, PT at 09/09/21 1027                Author: Lavella Nat BROCKS, PT  Service: Physical Therapy  Author Type: Physical Therapist       Filed: 09/09/21 1040  Date of Service: 09/09/21 1027  Status: Addendum          Editor: Lavella Nat BROCKS, PT (Physical Therapist)          Related Notes: Original Note by Lavella Nat BROCKS, PT (Physical Therapist) filed at 09/09/21  1039                  PHYSICAL THERAPY EVALUATION:                 Patient: Amy Sharp (85 y.o. female)   Room: 6618/6618   [x]   Patient DOB Verified      Date of Admission: 09/07/2021    Length of Stay:  0 day(s)   Primary Diagnosis: AMS (altered mental status) [R41.82]   Stroke (HCC) [I63.9]         Insurance: Payor: [employer]ICARE / Plan: [facility] [employer] PLUS / Product Type: Managed Care Medicare /         Date: 09/09/2021   In time: 08:57  Out time:  09:35         Precautions: Falls. Baseline Dementia.          Isolation:   There are currently no Active Isolations       MDRO: No active infections        Equipment: rolling walker, gait belt         Assessment:         Based on the objective data described below, the patient presents with   - ICU Mobility Level:  8 - Walking with assist of 1 person   - Modified Rankin Score (mRS): 3 - Moderate disability; requires some help but able to walk without (human) assistance - cane, walker etc.    - decreased mobility affecting function   - decreased independence with functional mobility   - decreased tolerance to sustained activity   - active participation in functional mobility training, bed mobility training, transfer training, gait training, balance training, therapeutic exercises, therapeutic activities, AD training, patient  education.   - active participation in gait training: 75 feet with gait belt/rolling walker, min assist x 1 with heavy cuing   - decreased safety awareness and understanding of need for assistance   - baseline dementia   -  denies pain     - highly motivated to participate in PT to improve functional activity tolerance and return to prior level of function      Functional Status Score for the Intensive Care Unit (FSS-ICU)      Task   Score     1.  Rolling  6 - Use of bed rail or object to pull on     2. Supine to Sit Transfer  6 - Use of bed rail or another object to pull on for support     3. Sit to Stand Transfer  6 - Use of bed rail, armrests, or another object to pull on for support     4. Sitting Edge of Bed  7 - Independent, maintains hands free     5. Walking  2 - Walks 50+ feet with the assistance of only 1 person      TOTAL  SCORE:  27/35        INITIAL TOTAL SCORE:                Patients rehabilitation potential for below stated goals: Good.        Recommendations:   Recommend continued physical therapy during acute stay. Occupational Therapy.  Recommend out of bed activity to counteract ill effects of bedrest, with assistance from staff as needed.   Discharge Recommendations: Home with family support and 24/7 supervision to assist as needed and for safety vs. SNF, HHPT, Patient will continue  to benefit from skilled PT services (HHPT) to increase overall strength, activity tolerance, standing balance and tolerance, functional mobility, safety awareness, and overall self care independence. Recommend overall need for supervision when returning  home for optimized safety   Further Equipment Recommendations for Discharge: rolling walker.          Plan:         Patient will benefit from skilled Physical Therapy intervention to address the above impairments to return to prior level of function. Patient will be seen 3-4 times/week for at least 1 week.      Patient will achieve PT goals in 1 weeks. Goals were created with input from the patient.        Physical Therapy Goals:        - Patient will be supervision with bed mobility in preparation for EOB activities.   - Patient will be standby assist with transfers in preparation for  OOB activities and ambulation.   - Patient will tolerate sitting up in chair/chair positioning for at least 1 hr/day in order to improve sitting tolerance.   - Patient will ambulate with supervision/set-up for 150 feet with the least restrictive device to promote functional independence at home.    - Patient will demonstrate good balance to safely enable upright activities and reduce risk for falls.   - Patient will demonstrate good activity tolerance during functional activities.   - Patient will demonstrate good safety awareness during functional activities.   - Patient will improve FSS-ICU score to at least 31/35 in order to indicate improved functional independence.   - Patient will be supervision with lower extremity home exercise program to increase strength and endurance.   - Patient will utilize energy conservation techniques during functional activities.        Planned interventions:         Skilled Physical Therapy services will provide functional mobility training, therapeutic exercises, therapeutic activities, patient/caregiver education as indicated.   Skilled Physical Therapy services will modify and progress therapeutic interventions, address functional mobility deficits, address ROM and strength deficits, analyze and cue movement  patterns and assess and modify postural abnormalities to reach the stated goals.        Subjective:         Patient agreeable to PT/OT co-evaluation to maximize safety and mobility. PT emphasis on balance and mobility and OT focus on ADLs. Patient pleasantly confused and very cooperative with therapy. I just don't remember answer to most PLOF questions. You  girls have me feeling great this morning!   Patient reports 0/10 pain before treatment and 0/10 pain at conclusion of treatment.        Objective Data Summary:         Orders, labs, and chart reviewed on Amy Sharp. Communicated with patient's nurse (patient ok to be seen by PT), 1:1 sitter at bedside .        Present illness  history:      Patient Active Problem List           Diagnosis  Date Noted         ?  Stroke [facility])  09/07/2021         ?  AMS (altered mental status)  09/07/2021         Previous medical history:      Past Medical History:        Diagnosis  Date         ?  Dementia (HCC)       ?  Hypertension           ?  Stroke [facility])              Prior Level of Function/Home Situation:    Information was obtained by chart review (patient is poor historian)   Home environment:  Patient lives with family (son) in a 1 story home.   Prior level of function: uses cane for ambulation at home and wheelchair in community, needs assistance with ADLs, meals and housekeeping provided   Home equipment: straight cane, wheelchair        Patient found:        Bed, (+) bed/chair exit alarm, (+) telemetry, (+) 1:1 sitter   Most recent value for oxygen in flow sheets:   O2 Device: None (Room air) (09/08/21 2000)          Patient received/participated in 25 minutes of treatment (functional mobility training, bed mobility training, education for positioning, gait training, balance training, therapeutic exercises, therapeutic  activities, AD training, patient education) during/immediately following PT evaluation.        Cognitive Status:        Mental Status: Alert and oriented to self and hospital   Communication: speech and language intact, patient interacts appropriately, baseline dementia   Follows commands: follows simple commands but requires redirection for compliance with previous directions, requires cues to stay on task, requires  tactile cues, requires verbal cues, easily distracted, baseline dementia   General Cognition: impaired, needs cues to stay on task, impaired problem solving, forgetful, impaired short term memory may prevent retention  of education   Safety/Judgement: needs cueing for safety and precautions        Extremities Assessment:         Sensation:   Intact to light touch bLE      Strength:     WFL, grossly  4+/5 BLE       Range Of Motion:   [facility] BLE        Therapeutic Activities; Functional Mobility and Balance Status:         Focus on functional mobility training, bed mobility training, education for positioning, balance training, therapeutic exercises, therapeutic activities, AD training, patient education. Provided cueing for hand placement, technique, and safety.          Bed Mobility:      Functional Status     Comments         Supine to sit  standby assist, verbal cues, requires additional time, use of bed rails       Sit to supine  standby assist, verbal cues, requires additional time, use of bed rails       Rolling  supervision, verbal cues, requires additional time, use of bed rails           Scooting  standby assist, verbal cues, requires additional time, use of bed rails  Transfers:      Functional Status     Comments         Sit to stand  contact guard assist/1 person assist, verbal cues, set-up, requires additional time, assistive device:rolling walker, gait belt  - performed x 4     Stand to sit  contact guard assist/1 person assist, verbal cues, set-up, requires additional time, assistive device:rolling walker, gait belt           Bed to commode  contact guard/min assist/1 person assist, verbal cues, set-up, requires additional time, assistive device:rolling walker, gait belt, hand held asssit              Ambulation/Gait Training:           Distance   75 feet      Analysis   decreased walking speed, shuffled gait, downward gaze, forward flexed posture, significant pathway deviations, impaired balance, decreased endurance, rest  breaks needed, motivated to increase distance           Assistance   contact guard/min assist/1 person assist, verbal cues, set-up, requires additional timeand cueing for posture, sequencing, walker management, activity pacing,  technique, obstacle avoidance, safety, and safety with turns         DME   rolling walker, gait belt           Stair Training   not tested,  unable, patient does not have to negotiate stairs at home            Functional Balance               Static sitting    Good static: patient able to maintain balance  without handhold support, limited postural sway      Dynamic sitting    Good dynamic: patient accepts moderate challenge      Static standing    good (accepts mod challenge, able to pick object off floor)         Dynamic standing    fair (accepts min challenge), benefits from UE support          Therapeutic Exercises:            Therapeutic Exercises   performed a few reps of each, AROM BLE, needs verbal cues to stay on task:    ankle pumps, heel slides in supine, hip ab/duction in supine, long arc quads (LAQ), straight leg raise, seated marches, standing marches           Balance   Activities/   Neuromuscular Re-Education   Worked to improve reactive postural responses: stable surface,  standing at sink, sitting edge of bed, sitting on commode, weight shift, bilaterally, reaching outside of base of support, reaching high/low, emphasis on sitting and standing balance in order to actively participate in functional/self-care tasks.     Required SBA-min A for balance support/correction and verbal cues for safety and technique.           Activity Tolerance:        - requires increased time, rest breaks and repeated attempts with functional tasks   - motivated to increase activity   - generalized fatigue   - vitals stable   - no apparent distress        Final Location:        Positioned in bed, all needs within reach. Patient agrees to call for assistance. Head of bed elevated. As found. Positioned with pillows for comfort. Patient set up with tray. Patient eating. (+)  bed  alarm. 1:1 sitter present.        Communication/Education:        Education: benefits of activity, OOB with assist from staff, OOB to chair for meals and mobilize as tolerated, activity as tolerated to counteract  ill effects of bedrest, promote healing and return to prior level of  function, activity pacing, energy conservation, call staff for assistance, safety, HEP, ankle pumps to promote improved circulation and prevent DVTs, DME, disposition, role of PT, PT  plan of care, all questions answered.      Education provided to: patient    Opportunity for questions and clarification was provided.      Readiness to learn indicated by: verbalized understanding, trying to perform skills, asking questions, and showing interest      Barriers to learning/limitations:  Yes;  baseline dementia      Comprehension: In agreement         Thank you for this referral.   Nat JAYSON Friedman, PT, DPT

## 2021-09-09 NOTE — Discharge Summary (Signed)
Discharge Summary       Patient: Amy Sharp Age: 85 y.o. 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 year old female with hx of CVA with right side weakness who presents with altered mental status. At baseline oriented to person only, however able to have conversation with family. Today, she was more weak and confused. Noted to have chronic right sided weakness. Patient is hypertensive on presentation. No signs of infectious etiology. ECG shows sinus bradycardia.   CT head shows subacute versus chronic left frontal lobe infarction.       Hospital Course by Problem     Patient seen in follow up for multiple medical problems as listed below :  Suspct TIA  Hx of  lacunar infarct in the left corona radiata to lentiform in 03/2021  Cont ASA, high dose statin   Neuro check  MRI brian : No acute intracranial findings.,Chronic left frontal lobe infarct  PT/OT/ST   HH recommended  ASA and Statin        DM2   Diabetes diet   hemoglobin A1c5.1  Actos was  hold since April of this year     Hypertension uncontrolled  Start Norvasc 5 mg daily  Monitor BP at home  F/U with PCP to adjust dose     HLD  Cont high intensity statin        Dementia  Mental status at baseline  Continue Aricept and Mamenda     Sinus bradycardia        Continue home regimen for otherwise chronic, stable medical conditions as noted above. Discussed with patient and or family regarding management, prognosis, treatment and complications of the patient's medical conditions in detail. All questions  answered to the satisfaction of those individual(s) who also verbalized understanding of and agreement with the assessment and plan    Stable to D/C from hospital         Today's examination of the patient revealed:     Objective:   VS: Visit Vitals  BP (!) 183/65 (BP 1 Location: Left upper arm, BP Patient Position: Supine)   Pulse 60   Temp 98.4 ??F (36.9 ??C)   Resp 16   Ht 5\' 6"  (1.676 m)   Wt 75.3 kg (166 lb)   SpO2 99%   Breastfeeding No   BMI 26.79 kg/m??      Tmax/24hrs: Temp (24hrs), Avg:98.1 ??F (36.7 ??C), Min:97.7 ??F (36.5 ??C), Max:98.6 ??F (37 ??C)     Input/Output: No intake or output data in the 24 hours ending 09/09/21 0942    General appearance: no distress  Eyes: sclera anicteric  ENT: no oral lesions, thyroid normal  Skin: no spider angiomata, jaundice,   Respiratory: clear to auscultation bilaterally  Cardiovascular: regular heart rate, no murmurs, no JVD  Abdomen: soft, non-tender, no rebound  Extremities: no muscle wasting, no gross arthritic changes  GU: not examined  Neurologic: alert and oriented, cranial nerves grossly intact,  Labs:    No results found for this or any previous visit (from the past 24 hour(s)).  Additional Data Reviewed:      XR Results (most recent):  Results from Hospital Encounter encounter on 09/07/21    XR CHEST SNGL V    Narrative  XR CHEST SNGL V    Clinical indication: AMS.    Comparison: None    Support devices: None    Lungs: Lungs are clear. No pleural effusions. No pneumothorax.    Cardiomediastinal: Normal morphology for radiographic technique.    Bones/soft tissues: No acute abnormalities. Screw anchor in the left humeral  head noted.    Impression  IMPRESSION:    No acute cardiopulmonary process.    Electronically signed by: Linus Galas, MD 09/07/2021 9:22 PM EDT      MRI Results (most recent):  Results from Hospital Encounter encounter on 09/07/21    MRI BRAIN WO CONT    Narrative  MRI of the brain    INDICATION: Right-sided weakness.  COMPARISON: No  relevant studies.    TECHNIQUE: Multiplanar, multisequence MRI of the brain was performed without  intravenous contrast.    FINDINGS:    Images are degraded by patient motion.    There is no restricted diffusion to suggest an acute infarct.    There is no mass, midline shift, intracranial hemorrhage, hydrocephalus, or  extra-axial fluid collection. Corresponding to CT abnormality, there is focal  encephalomalacia within the medial aspect of the anterior left frontal lobe with  volume loss compatible with chronic infarct. There is a chronic subcentimeter  left subinsular lacunar infarct. There are moderate chronic microvascular  ischemic changes with moderate generalized cortical atrophy.    There is a partial empty sella configuration, anatomic variant.    The visualized portions of the paranasal sinuses and mastoid air cells appear  well aerated.    The major proximal flow voids appear patent.    Impression  IMPRESSION:    1.  No acute intracranial findings.  2.  Chronic left frontal lobe infarct.  3.  Moderate chronic microvascular ischemic changes with moderate cortical  atrophy.    Electronically signed by: Jodi Mourning, MD 09/08/2021 11:37 AM EDT      CT Results (most recent):  Results from Hospital Encounter encounter on 09/07/21    CT HEAD WO CONT    Narrative  HEAD CT WITHOUT CONTRAST    INDICATION: AMS.    COMPARISON: No relevant studies.    TECHNIQUE: Transaxial images were obtained from the skull base to the vertex  without intravenous contrast with coronal and sagittal reformatted images.    All CT exams at this facility use one or more dose reduction techniques  including automatic exposure control, mA/kV adjustment per patient's size, or  iterative reconstruction technique.    FINDINGS:    Scout image: Negative.    Parenchyma: Hypoattenuation along the left frontal lobe left ACA vascular  territory compatible with subacute or chronic infarction. Small chronic lacunar  infarct left basal ganglia.  Remainder of the gray-white matter differentiation  is preserved. Age-related generalized cerebral volume loss. Empty sella  morphology.    Extra-axial Collection:  None    Ventricles: No hydrocephalus.    Included Orbits: Negative    Sinuses:  Predominantly clear    Bones: No acute findings    Soft tissues:  Negative    Impression  IMPRESSION:    1.  Subacute versus chronic left frontal lobe infarction. Difficult to delineate  without comparison exam. This could be confirmed with MRI, as clinically  indicated.  2.  Otherwise, no acute intracranial process  3.  Chronic microvascular changes      Electronically signed by: Linus Galas, MD 09/07/2021 8:58 PM EDT           Condition:   Stable    Diet:   Cardiac diet  Disposition:    [] Home   [x] Home with Home Health   [] SNF/NH   [] Rehab   [] Home with family   [] Alternate Facility:____________________      Discharge Medications:        My Medications        START taking these medications        Instructions Each Dose to Equal Morning Noon Evening Bedtime   amLODIPine 5 mg tablet  Commonly known as: NORVASC    Your last dose was:     Your next dose is:         Take 1 Tablet by mouth daily.   5 mg                        CONTINUE taking these medications        Instructions Each Dose to Equal Morning Noon Evening Bedtime   acetaZOLAMIDE 250 mg tablet  Commonly known as: DIAMOX    Your last dose was:     Your next dose is:         Take 250 mg by mouth two (2) times a day.   250 mg                 aspirin 81 mg chewable tablet    Your last dose was:     Your next dose is:         Take 81 mg by mouth daily. CVA   81 mg                 atorvastatin 40 mg tablet  Commonly known as: LIPITOR    Your last dose was:     Your next dose is:         Take 40 mg by mouth nightly. Indications: high cholesterol   40 mg                 donepeziL 10 mg tablet  Commonly known as: ARICEPT    Your last dose was:     Your next dose is:         Take 10 mg by mouth once. Indications: mild to  moderate Alzheimer's type dementia   10 mg                 memantine 10 mg tablet  Commonly known as: NAMENDA    Your last dose was:     Your next dose is:         Take 10 mg by mouth nightly. Indications: moderate to severe Alzheimer's type dementia   10 mg                 omeprazole 40 mg capsule  Commonly known as: PRILOSEC    Your last dose was:     Your next dose is:         Take 40 mg by mouth daily. Indications: gastroesophageal reflux disease   40 mg  Where to Get Your Medications        These medications were sent to [id number] - [place], [place] - [address] AT [facility] OF [address]  [address], [place] [address]      Phone: [phone]   amLODIPine 5 mg tablet             Follow-up Appointments and instructions:   Your PCP: Rosalie Doctor, NP, within 3-5 days  Follow-up Information    None           Consults:     Treatment Team: Treatment Team: Attending Provider: Rosemary Holms, MD; Consulting Provider: Rosemary Holms, MD; Staff Nurse: Malon Kindle, RN; Primary Nurse: Lawerance Cruel, RN; Physical Therapist: Jacquenette Shone, PT; Occupational Therapist: Curlene Dolphin, OT    Significant Diagnostic Studies:       Please follow-up on tests/labs that are still pending:    Thank you Dr Adolm Joseph, Jessy Oto, NP for allowing [place] to participate in the care of Orlean Patten     [facility] medical dictation software was used for portions of this report.  Unintended voice transcription errors may have occurred.)      >42 minutes spent coordinating this discharge (review instructions/follow-up, prescriptions)    Signed:    [facility]  Rosemary Holms, MD  09/09/2021  9:42 AM

## 2021-09-09 NOTE — Progress Notes (Signed)
Rw delivered to bedside  Son discussing with wife on [facility]

## 2021-10-21 ENCOUNTER — Emergency Department: Admit: 2021-10-22 | Payer: MEDICARE | Primary: Nurse Practitioner

## 2021-10-21 DIAGNOSIS — R479 Unspecified speech disturbances: Secondary | ICD-10-CM

## 2021-10-21 DIAGNOSIS — R4182 Altered mental status, unspecified: Secondary | ICD-10-CM

## 2021-10-21 NOTE — H&P (Signed)
Hospitalist Admission History and Physical    NAME:  Amy Sharp   DOB:   Jun 09, 1936   MRN:   [id number]     PCP:  Mariann Barter, NP  Admission Date/Time:  10/21/2021 9:47 PM  Anticipated Date of Discharge: 10/23/2021  Anticipated Disposition (home, SNF) : [facility]         Assessment/Plan:      Active Problems:    Speech disturbance (10/21/2021)         Assessment Amy Sharp   1.  Speech disturbance, confusion rule out CVA  Telemetry  Aspirin, statin  CT of the head is negative  Check MRI of the brain/EEG as recommended by telemetry neurology    2.  Hypertension  Continue amlodipine      3.  Dementia  Continue Aricept, Namenda    4.  History of bradycardia      5.  History of CVA with right-sided deficit and right eye blindness                  Risk of deterioration:  [x] Low    [] Moderate  [] High    Prophylaxis:  [] Lovenox  [] Coumadin  [x] Hep SQ  [] SCD???s  [] H2B/PPI[] Eliquis [] Xarelto     Disposition:  [] Home w/ Family   [x] HH PT,OT,RN   [] SNF/LTC   [] SAH/Rehab       Full code as discussed with patient and patient's power of attorney son [id number] [place]  . Phone number for the power of attorney and the medical decision maker [phone] . Discussed nature of interventions like cardiopulmonary resuscitation/ventilatory support and patient's power of attorney clearly voices that she  would want all life prolonging measures or aggressive interventions as needed to be alive. Time spent exclusively in advance care planning is 17 minutes      Subjective:   CHIEF COMPLAINT:    Chief Complaint   Patient presents with    Aphasia       HISTORY OF PRESENT ILLNESS:     Amy Sharp is a 85 y.o. BLACK/[place] female who presents with neurological symptoms  Patient woke up from a nap around 7 PM, had garbled speech that lasted less than 10 minutes  As per family is reported to the paramedics she had laid down for a nap around 6 PM, was at her baseline mental status  Patient is pleasantly confused, has dementia  She has chronic right eye  vision loss    Past Medical History:   Diagnosis Date    Dementia ([place])     Hypertension     Stroke [facility])    Breast cancer    Surgical history  Right breast lumpectomy  Rotator cuff surgery  Right knee surgery  Hysterectomy    Social History     Tobacco Use    Smoking status: Not on file    Smokeless tobacco: Not on file   Substance Use Topics    Alcohol use: Not on file        Family history  Father had heart disease  Mother had strokes    No Known Allergies     Prior to Admission Medications   Prescriptions Last Dose Informant Patient Reported? Taking?   acetaZOLAMIDE (DIAMOX) 250 mg tablet   Yes No   Sig: Take 250 mg by mouth two (2) times a day.   amLODIPine (NORVASC) 5 mg tablet   No No   Sig: Take 1 Tablet by mouth daily.   aspirin 81 mg chewable tablet  Yes No   Sig: Take 81 mg by mouth daily. CVA   atorvastatin (LIPITOR) 40 mg tablet   Yes No   Sig: Take 40 mg by mouth nightly. Indications: high cholesterol   donepeziL (ARICEPT) 10 mg tablet   Yes No   Sig: Take 10 mg by mouth once. Indications: mild to moderate Alzheimer's type dementia   memantine (NAMENDA) 10 mg tablet   Yes No   Sig: Take 10 mg by mouth nightly. Indications: moderate to severe Alzheimer's type dementia   omeprazole (PRILOSEC) 40 mg capsule   Yes No   Sig: Take 40 mg by mouth daily. Indications: gastroesophageal reflux disease      Facility-Administered Medications: None           REVIEW OF SYSTEMS:    As above in HPI   Pt  has dementia         Objective:   VITALS:    Visit Vitals  BP (!) 179/102   Pulse (!) 49   Temp 97.7 ??F (36.5 ??C)   Resp 22   Wt 69.3 kg (152 lb 12.8 oz)   SpO2 100%   BMI 24.66 kg/m??     Temp (24hrs), Avg:97.7 ??F (36.5 ??C), Min:97.7 ??F (36.5 ??C), Max:97.7 ??F (36.5 ??C)      PHYSICAL EXAM: (Seen with PPE , gloves , gown , mask n95 and goggles )  General:    Alert, cooperative, no distress, appears stated age.     Head:   Normocephalic, without obvious abnormality, atraumatic.  Eyes:   Conjunctivae clear, anicteric  sclerae.  Right eye blindness   Nose:  Nares normal. No drainage or sinus tenderness.  Throat:    Lips, mucosa, and tongue normal.  No Thrush  Neck:  Supple, symmetrical,  no adenopathy, thyroid: non tender    no carotid bruit and no JVD.  Back:    Symmetric,  No CVA tenderness.  Lungs:   Clear to auscultation bilaterally.  No Wheezing or Rhonchi. No rales.  Chest wall:  No tenderness or deformity. No Accessory muscle use.  Heart:   Regular rate and rhythm,  no murmur, rub or gallop.  Abdomen:   Soft, non-tender. Not distended.  Bowel sounds normal. No masses  Extremities: Extremities normal, atraumatic, No cyanosis.  No edema. No clubbing  Skin:     Texture, turgor normal. No rashes or lesions.  Not Jaundiced  Psych:  Not agitated  Neurologic: Awake, alert,disoriented to person and month. No dysarthria, no aphasia.  Cranial Nerves: Pupils 3 mm, reactive. Face symmetrical. EOMI, visual fields intact, face sensation  symmetrical, tongue midline.  Motor exam: No drift in either arm. Interossei and biceps/triceps symmetrical and full strength as  per the nurse. No drift in either leg.  Mild residual right-sided weakness from prior stroke  Sensory exam: no asymmetry noted to light touch.  Reflexes: deferred.  Cerebellar exam: No dysmetria or dysdiadokinesis on finger tapping.      LAB DATA REVIEWED:    Recent Results (from the past 12 hour(s))   TROPONIN-HIGH SENSITIVITY    Collection Time: 10/21/21  8:05 PM   Result Value Ref Range    Troponin-High Sensitivity 13 0 - 34 ng/L   CBC WITH AUTOMATED DIFF    Collection Time: 10/21/21  8:05 PM   Result Value Ref Range    WBC 6.8 4.0 - 11.0 1000/mm3    RBC 4.21 3.60 - 5.20 M/uL    HGB 13.2 13.0 - 17.2 gm/dl  HCT 41.0 37.0 - 50.0 %    MCV 97.4 80.0 - 98.0 fL    MCH 31.4 25.4 - 34.6 pg    MCHC 32.2 30.0 - 36.0 gm/dl    PLATELET 230 140 - 450 1000/mm3    MPV 11.2 (H) 6.0 - 10.0 fL    RDW-SD 50.4 (H) 36.4 - 46.3      NRBC 0 0 - 0      IMMATURE GRANULOCYTES 0.1 0.0 - 3.0 %     NEUTROPHILS 50.4 34 - 64 %    LYMPHOCYTES 37.7 28 - 48 %    MONOCYTES 8.7 1 - 13 %    EOSINOPHILS 2.5 0 - 5 %    BASOPHILS 0.6 0 - 3 %   METABOLIC PANEL, COMPREHENSIVE    Collection Time: 10/21/21  8:05 PM   Result Value Ref Range    Potassium 4.1 3.5 - 5.1 mEq/L    Chloride 114 (H) 98 - 107 mEq/L    Sodium 142 136 - 145 mEq/L    CO2 20 20 - 31 mEq/L    Glucose 114 (H) 74 - 106 mg/dl    BUN 13 9 - 23 mg/dl    Creatinine 0.74 0.55 - 1.02 mg/dl    GFR est AA >60.0      GFR est non-AA >60      Calcium 9.6 8.7 - 10.4 mg/dl    Anion gap 8 5 - 15 mmol/L    AST (SGOT) 28.0 0.0 - 33.9 U/L    ALT (SGPT) 15 10 - 49 U/L    Alk. phosphatase 180 (H) 46 - 116 U/L    Bilirubin, total 0.80 0.30 - 1.20 mg/dl    Protein, total 7.3 5.7 - 8.2 gm/dl    Albumin 3.8 3.4 - 5.0 gm/dl   PROTHROMBIN TIME + INR    Collection Time: 10/21/21  8:05 PM   Result Value Ref Range    Prothrombin time 11.7 10.2 - 12.9 seconds    INR 1.0 0.1 - 1.1     DRUG SCREEN, URINE    Collection Time: 10/21/21  8:20 PM   Result Value Ref Range    Amphetamine NEGATIVE NEGATIVE      Barbiturates NEGATIVE NEGATIVE      Benzodiazepines NEGATIVE NEGATIVE      Cocaine NEGATIVE NEGATIVE      Marijuana NEGATIVE NEGATIVE      Methadone NEGATIVE NEGATIVE      Opiates NEGATIVE NEGATIVE      Phencyclidine NEGATIVE NEGATIVE     [id number]    Collection Time: 10/21/21  8:20 PM    Specimen: NASOPHARYNGEAL SWAB   Result Value Ref Range    COVID-19 NEGATIVE NEGATIVE      Influenza A PCR NEGATIVE NEGATIVE      Influenza B PCR NEGATIVE NEGATIVE      RSV by PCR NEGATIVE NEGATIVE     POC URINE MACROSCOPIC    Collection Time: 10/21/21  8:30 PM   Result Value Ref Range    Glucose Negative NEGATIVE,Negative mg/dl    Bilirubin Negative NEGATIVE,Negative      Ketone Negative NEGATIVE,Negative mg/dl    Specific gravity 1.020 1.005 - 1.030      Blood Negative NEGATIVE,Negative      pH (UA) 7.0 5 - 9      Protein Negative NEGATIVE,Negative mg/dl    Urobilinogen 1.0 0.0 - 1.0 EU/dl     Nitrites Negative NEGATIVE,Negative      Leukocyte Esterase  Negative NEGATIVE,Negative      Color Yellow      Appearance Clear           IMAGING RESULTS:    XR CHEST SNGL V    Result Date: 10/21/2021  INDICATION: Stroke. Dyspnea COMPARISON: 09/07/2021 TECHNIQUE: Single view chest.     IMPRESSION: Cardiomegaly. Mild bibasilar linear atelectatic changes. Mild vascular congestion. No acute infiltrate. Electronically signed by: Axel Filler, MD 10/21/2021 9:12 PM EST     CT HEAD WO CONT    Result Date: 10/21/2021  INDICATION: speech disturbance. Slurred speech, stroke alert COMPARISON: Brain MRI 09/08/2021, head CT dated 09/07/2021 TECHNIQUE: Axial Head CT without IV contrast. Sagittal and coronal reconstructions were performed. All CT exams at this facility use one or more dose reduction techniques including automatic exposure control, mA/kV adjustment per patient's size, or iterative reconstruction technique. FINDINGS: Some beam hardening artifact obscures detail in the posterior fossa. Volume: Age-related cerebral atrophy. Acute Ischemia: None. Chronic Ischemia: Chronic small vessel ischemic changes, moderately advanced for patient age. Chronic left frontal lobe infarction. Chronic lacunar infarctions involving the bilateral basal ganglia. Mass: None Hemorrhage: No acute intracranial hemorrhage. Ventricles: No hydrocephalus. Major arteries, dural sinuses: No hyperdensity to suggest thrombosis. Calvarium: Intact. Other discussion: Hyperostosis frontalis of the calvarium. ASPECTS SCORE: 10     IMPRESSION: 1.  No acute intracranial abnormalities. 2.  Chronic ischemic changes. 3.  If there is strong clinical suspicion for acute infarction, recommend follow-up with MRI brain.  Electronically signed by: Axel Filler, MD 10/21/2021 8:06 PM EST        Care Plan discussed with:     [x] Patient   [x] Family    [] ED Care Manager  [] ED Doc   [] Specialist :    Total care time spent on reviewing the case/data/notes/EMR,examining the  patient,documentation,coordinating care with nurses and consultants is 39  minutes.       ___________________________________________________  Admitting Physician: Charlaine Dalton, MD     [employer] medical dictation software was used for portions of this report.  Unintended voice transcription errors may have occurred.

## 2021-10-21 NOTE — ED Notes (Signed)
Pt able to lift hips and turn in bed without assistance. Pt belongings placed in belongings bag.

## 2021-10-21 NOTE — Progress Notes (Signed)
Patient arrived via EMS, from  home, after family states she awoke from a nap, at around 1900, with garbled speech that lasted less than 10 mins. Family stated to EMS that she had laid down for a nap at 1800 and was at her baseline at that time.   On assessment, patient is pleasantly confused, oriented to self only. Has dementia at baseline. Follows simple commands. No drift or ataxia noted. Denies any sensation deficits. Speech is clear and coherent at this time. Has some R eye vision loss, but patient states that she has had that for years. No family at bedside at time of assessment.   Patronus consulted, Dr. Luciana Axe assessed patient, recommendations for MRI and TIA workup. Had similar workup for same symptoms in September of this year. Dr. Thana Ates informed of recommendations.   Education packet left at bedside. Awaiting admit and further orders.

## 2021-10-21 NOTE — ED Notes (Signed)
Pt son and wife at bedside. Pt son aware of plan of care and pt admission. Son requesting to be called with any updates or when pt is moved to a room.       [place] [phone]

## 2021-10-21 NOTE — ED Notes (Signed)
Pt arrives by ems from home.     Pt with hx of dementia woke up from nap at 1800 with garbled speech, resolved by 1900.    [id number]     Pt was recently here a month ago with same symptoms and had stroke workup done.

## 2021-10-21 NOTE — ED Notes (Signed)
Pt placed in gown and put on monitor. Pt able to communicate clearly. Pt oriented to self and place. Pt unable to verbalize why she is here or what happened prior to arriving to ED. Pt has Dementia orientation at baseline.

## 2021-10-21 NOTE — ED Provider Notes (Signed)
[facility]  Emergency Department Treatment Report    Patient: Amy Sharp Age: 85 y.o. Sex: female    Date of Birth: 04-19-1936 Admit Date: 10/21/2021 PCP: Mariann Barter, NP   MRN: [id number]  CSN: [id number]     Room: ER38/ER38 Time Dictated: 8:43 PM        I hereby certify this patient for admission based upon medical necessity as    noted below:    Chief Complaint   Stroke alert  History of Present Illness   85 y.o. female coming to the ER by EMS.  I spoke to the medics.  They report that the patient coming from home family reports that she laid down for a nap around 6 PM and then woke up and had garbled speech.  They report patient had stable vital signs and the patient has a history of dementia    On exam the patient is awake and alert she denies any pain or shortness of breath vomiting or diarrhea.  She denies headache chest pain abdominal pain neck pain or back pain.    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  The patient had a MRI brain showing no acute findings.  She also has a history of diabetes hypertension hyperlipidemia dementia.    Family arrived and said that patient had this episode of confusion today after the nap where she had garbled speech and then could not lift up either arm.  She seemed very weak.  Her amlodipine was recently decreased from 10 mg down to 5 mg.  No recent fevers falls vomiting diarrhea trouble breathing.  Currently they are at bedside and say the patient is back to her usual self.    Review of Systems   Review of Systems   Constitutional:  Negative for fever.   HENT:  Negative for congestion and sore throat.    Respiratory:  Negative for cough and shortness of breath.    Cardiovascular:  Negative for chest pain.    Gastrointestinal:  Negative for abdominal pain, diarrhea and vomiting.   Genitourinary:  Negative for dysuria.   Musculoskeletal:  Negative for neck pain.   Neurological:  Negative for headaches.        See HPI      Past Medical/Surgical History     Past Medical History:   Diagnosis Date    Dementia ([place])     Hypertension     Stroke ([place])      No past surgical history on file.     Social History     Social History     Socioeconomic History    Marital status: WIDOWED      Lives with family    Family History   No family history on file.     Current Medications     Prior to Admission Medications   Prescriptions Last Dose Informant Patient Reported? Taking?   acetaZOLAMIDE (DIAMOX) 250 mg tablet   Yes No   Sig: Take 250 mg by mouth two (2) times a day.   amLODIPine (NORVASC) 5 mg tablet   No No   Sig: Take 1 Tablet by mouth daily.   aspirin 81 mg chewable tablet   Yes No   Sig: Take 81 mg by mouth daily. CVA  atorvastatin (LIPITOR) 40 mg tablet   Yes No   Sig: Take 40 mg by mouth nightly. Indications: high cholesterol   donepeziL (ARICEPT) 10 mg tablet   Yes No   Sig: Take 10 mg by mouth once. Indications: mild to moderate Alzheimer's type dementia   memantine (NAMENDA) 10 mg tablet   Yes No   Sig: Take 10 mg by mouth nightly. Indications: moderate to severe Alzheimer's type dementia   omeprazole (PRILOSEC) 40 mg capsule   Yes No   Sig: Take 40 mg by mouth daily. Indications: gastroesophageal reflux disease      Facility-Administered Medications: None     Allergies   No Known Allergies  Physical Exam     ED Triage Vitals   Enc Vitals Group      BP 10/21/21 2010 (!) 179/102      Pulse (Heart Rate) 10/21/21 2010 (!) 49      Resp Rate 10/21/21 2010 22      Temp 10/21/21 2010 97.7 ??F (36.5 ??C)      Temp src --       O2 Sat (%) 10/21/21 2010 100 %      Weight 10/21/21 2008 152 lb 12.8 oz      Height --       Head Circumference --       Peak Flow --       Pain Score --       Pain Loc --       Pain Edu? --       Excl. in  [place]? --        Physical Exam  HENT:      Head: Normocephalic.   Eyes:      Conjunctiva/sclera: Conjunctivae normal.      Pupils: Pupils are equal, round, and reactive to light.      Comments: Patient states decreased vision in R eye (chronic)   Cardiovascular:      Rate and Rhythm: Regular rhythm. Bradycardia present.   Pulmonary:      Effort: Pulmonary effort is normal.      Breath sounds: Normal breath sounds.   Abdominal:      Palpations: Abdomen is soft.      Tenderness: There is no abdominal tenderness.   Musculoskeletal:      Cervical back: Normal range of motion and neck supple. No tenderness.      Right lower leg: No edema.      Left lower leg: No edema.   Skin:     General: Skin is dry.   Neurological:      Mental Status: She is alert.      Comments: Correctly states her name  States the wrong birthday (identify verified by EMS), does not know thanksgiving is the holiday coming up  Does not know the year    No drift in arms b/l  No drift in legs b/l  No facial droop  No dysarthria  Normal sensation face arms legs b/l     Psychiatric:         Mood and Affect: Mood normal.         Impression and Management Plan   Patient coming in for episode of garbled speech that seems to have resolved.  She does not appear to have any focal findings.  I am sending her for a head CT she will see telemetry neuro per protocol.Also check a urine and labs and because she complained of feeling weak and tired  all day I will check her for flu and COVID  Diagnostic Studies   Lab:   Recent Results (from the past 12 hour(s))   TROPONIN-HIGH SENSITIVITY    Collection Time: 10/21/21  8:05 PM   Result Value Ref Range    Troponin-High Sensitivity 13 0 - 34 ng/L   CBC WITH AUTOMATED DIFF    Collection Time: 10/21/21  8:05 PM   Result Value Ref Range    WBC 6.8 4.0 - 11.0 1000/mm3    RBC 4.21 3.60 - 5.20 M/uL    HGB 13.2 13.0 - 17.2 gm/dl    HCT 41.0 37.0 - 50.0 %    MCV 97.4 80.0 - 98.0 fL    MCH 31.4 25.4 - 34.6 pg    MCHC 32.2 30.0 -  36.0 gm/dl    PLATELET 230 140 - 450 1000/mm3    MPV 11.2 (H) 6.0 - 10.0 fL    RDW-SD 50.4 (H) 36.4 - 46.3      NRBC 0 0 - 0      IMMATURE GRANULOCYTES 0.1 0.0 - 3.0 %    NEUTROPHILS 50.4 34 - 64 %    LYMPHOCYTES 37.7 28 - 48 %    MONOCYTES 8.7 1 - 13 %    EOSINOPHILS 2.5 0 - 5 %    BASOPHILS 0.6 0 - 3 %   METABOLIC PANEL, COMPREHENSIVE    Collection Time: 10/21/21  8:05 PM   Result Value Ref Range    Potassium 4.1 3.5 - 5.1 mEq/L    Chloride 114 (H) 98 - 107 mEq/L    Sodium 142 136 - 145 mEq/L    CO2 20 20 - 31 mEq/L    Glucose 114 (H) 74 - 106 mg/dl    BUN 13 9 - 23 mg/dl    Creatinine 0.74 0.55 - 1.02 mg/dl    GFR est AA >60.0      GFR est non-AA >60      Calcium 9.6 8.7 - 10.4 mg/dl    Anion gap 8 5 - 15 mmol/L    AST (SGOT) 28.0 0.0 - 33.9 U/L    ALT (SGPT) 15 10 - 49 U/L    Alk. phosphatase 180 (H) 46 - 116 U/L    Bilirubin, total 0.80 0.30 - 1.20 mg/dl    Protein, total 7.3 5.7 - 8.2 gm/dl    Albumin 3.8 3.4 - 5.0 gm/dl   PROTHROMBIN TIME + INR    Collection Time: 10/21/21  8:05 PM   Result Value Ref Range    Prothrombin time 11.7 10.2 - 12.9 seconds    INR 1.0 0.1 - 1.1     DRUG SCREEN, URINE    Collection Time: 10/21/21  8:20 PM   Result Value Ref Range    Amphetamine NEGATIVE NEGATIVE      Barbiturates NEGATIVE NEGATIVE      Benzodiazepines NEGATIVE NEGATIVE      Cocaine NEGATIVE NEGATIVE      Marijuana NEGATIVE NEGATIVE      Methadone NEGATIVE NEGATIVE      Opiates NEGATIVE NEGATIVE      Phencyclidine NEGATIVE NEGATIVE     SARS-[id number]-[id number]    Collection Time: 10/21/21  8:20 PM    Specimen: NASOPHARYNGEAL SWAB   Result Value Ref Range    COVID-19 NEGATIVE NEGATIVE      Influenza A PCR NEGATIVE NEGATIVE      Influenza B PCR NEGATIVE NEGATIVE      RSV by PCR NEGATIVE  NEGATIVE     POC URINE MACROSCOPIC    Collection Time: 10/21/21  8:30 PM   Result Value Ref Range    Glucose Negative NEGATIVE,Negative mg/dl    Bilirubin Negative NEGATIVE,Negative      Ketone Negative NEGATIVE,Negative mg/dl    Specific  gravity 1.020 1.005 - 1.030      Blood Negative NEGATIVE,Negative      pH (UA) 7.0 5 - 9      Protein Negative NEGATIVE,Negative mg/dl    Urobilinogen 1.0 0.0 - 1.0 EU/dl    Nitrites Negative NEGATIVE,Negative      Leukocyte Esterase Negative NEGATIVE,Negative      Color Yellow      Appearance Clear         Imaging:    XR CHEST SNGL V    Result Date: 10/21/2021  INDICATION: Stroke. Dyspnea COMPARISON: 09/07/2021 TECHNIQUE: Single view chest.     IMPRESSION: Cardiomegaly. Mild bibasilar linear atelectatic changes. Mild vascular congestion. No acute infiltrate. Electronically signed by: Axel Filler, MD 10/21/2021 9:12 PM EST     CT HEAD WO CONT    Result Date: 10/21/2021  INDICATION: speech disturbance. Slurred speech, stroke alert COMPARISON: Brain MRI 09/08/2021, head CT dated 09/07/2021 TECHNIQUE: Axial Head CT without IV contrast. Sagittal and coronal reconstructions were performed. All CT exams at this facility use one or more dose reduction techniques including automatic exposure control, mA/kV adjustment per patient's size, or iterative reconstruction technique. FINDINGS: Some beam hardening artifact obscures detail in the posterior fossa. Volume: Age-related cerebral atrophy. Acute Ischemia: None. Chronic Ischemia: Chronic small vessel ischemic changes, moderately advanced for patient age. Chronic left frontal lobe infarction. Chronic lacunar infarctions involving the bilateral basal ganglia. Mass: None Hemorrhage: No acute intracranial hemorrhage. Ventricles: No hydrocephalus. Major arteries, dural sinuses: No hyperdensity to suggest thrombosis. Calvarium: Intact. Other discussion: Hyperostosis frontalis of the calvarium. ASPECTS SCORE: 10     IMPRESSION: 1.  No acute intracranial abnormalities. 2.  Chronic ischemic changes. 3.  If there is strong clinical suspicion for acute infarction, recommend follow-up with MRI brain.  Electronically signed by: Axel Filler, MD 10/21/2021 8:06 PM EST        Patient was  seen by the telemetry neurologist Dr. Alisa Graff.  He recommended admission medical work-up EEG, patient not a candidate for tPA.  He did not recommend any other emergent imaging at this time.  Results and plan discussed with patient's family and patient she was resting comfortably with no new symptoms she will be admitted to Dr. Winona Legato to telemetry at recommendation of telemetry neurology.      ED Course as of 10/21/21 2156   Nancy Fetter Oct 21, 2021   2043 Urine dip is negative CBC has a normal white count hemoglobin and platelets INR is 1 [SM]   2043 EKG shows a sinus bradycardia rate of 54 PR 164 QRS 88 QTC 458 no acute ST elevations or depressions interpreted by me [SM]   2116 The chemistry showing a chloride 114 glucose 114 troponin is normal urine dip is negative CBC unremarkable [SM]   2117 Head CT no acute abnormalities [SM]   2117 Chest x-ray read by radiology: Cardiomegaly mild bibasilar linear atelectatic changes mild vascular congestions no acute infiltrate [SM]      ED Course User Index  [SM] Maylon Cos, MD     Medications   acetaZOLAMIDE (DIAMOX) tablet 250 mg (has no administration in time range)   amLODIPine (NORVASC) tablet 5 mg (has no administration in time  range)   aspirin chewable tablet 81 mg (has no administration in time range)   atorvastatin (LIPITOR) tablet 40 mg (has no administration in time range)   donepeziL (ARICEPT) tablet 10 mg (has no administration in time range)   memantine (NAMENDA) tablet 10 mg (has no administration in time range)   omeprazole (PRILOSEC) capsule 40 mg (has no administration in time range)   sodium chloride (NS) flush 5-10 mL (has no administration in time range)   sodium chloride (NS) flush 5-10 mL (has no administration in time range)   naloxone (NARCAN) injection 0.1 mg (has no administration in time range)   acetaminophen (TYLENOL) tablet 650 mg (has no administration in time range)   HYDROcodone-acetaminophen (NORCO) 5-325 mg per tablet 1 Tablet (has no  administration in time range)   alum-mag hydroxide-simeth (MYLANTA) oral suspension 30 mL (has no administration in time range)   albuterol (PROVENTIL VENTOLIN) nebulizer solution 2.5 mg (has no administration in time range)   heparin (porcine) injection 5,000 Units (has no administration in time range)       Final Diagnosis       ICD-10-CM ICD-9-CM   1. Speech disturbance, unspecified type  R47.9 784.59       Disposition   Tele      Maylon Cos, MD  October 21, 2021      Dragon dictation software was used for portions of this report. Unintended errors in transcription may occur.    My signature above authenticates this document and my orders, the final    diagnosis (es), discharge prescription (s), and instructions in the Epic    record.  If you have any questions please contact [phone].     Nursing notes have been reviewed by the physician/ advanced practice    Clinician.

## 2021-10-21 NOTE — ED Notes (Signed)
Patient placed on a purwick. Patient's family went home and took dentures and patient's clothing with them, they mentioned patient does well without dentures.

## 2021-10-22 ENCOUNTER — Observation Stay: Admit: 2021-10-22 | Payer: MEDICARE | Primary: Nurse Practitioner

## 2021-10-22 ENCOUNTER — Observation Stay
Admit: 2021-10-22 | Discharge: 2021-10-23 | Disposition: A | Discharge status: Home Health | Payer: MEDICARE | Attending: Internal Medicine | Admitting: Internal Medicine

## 2021-10-22 LAB — CBC WITH AUTO DIFFERENTIAL
Basophils %: 0.6 % (ref 0–3)
Basophils %: 0.8 % (ref 0–3)
Eosinophils %: 2.5 % (ref 0–5)
Eosinophils %: 3.1 % (ref 0–5)
Hematocrit: 41 % (ref 37.0–50.0)
Hematocrit: 38.7 % (ref 37.0–50.0)
Hemoglobin: 13.2 gm/dl (ref 13.0–17.2)
Hemoglobin: 12.6 gm/dl — ABNORMAL LOW (ref 13.0–17.2)
Immature Granulocytes: 0.1 % (ref 0.0–3.0)
Immature Granulocytes: 0.2 % (ref 0.0–3.0)
Lymphocytes %: 37.7 % (ref 28–48)
Lymphocytes %: 36 % (ref 28–48)
MCH: 31.4 pg (ref 25.4–34.6)
MCH: 31.5 pg (ref 25.4–34.6)
MCHC: 32.2 gm/dl (ref 30.0–36.0)
MCHC: 32.6 gm/dl (ref 30.0–36.0)
MCV: 97.4 fL (ref 80.0–98.0)
MCV: 96.8 fL (ref 80.0–98.0)
MPV: 11.2 fL — ABNORMAL HIGH (ref 6.0–10.0)
MPV: 10.4 fL — ABNORMAL HIGH (ref 6.0–10.0)
Monocytes %: 8.7 % (ref 1–13)
Monocytes %: 9.9 % (ref 1–13)
Neutrophils %: 50.4 % (ref 34–64)
Neutrophils %: 50 % (ref 34–64)
Nucleated RBCs: 0 (ref 0–0)
Nucleated RBCs: 0 (ref 0–0)
Platelets: 230 10*3/uL (ref 140–450)
Platelets: 252 10*3/uL (ref 140–450)
RBC: 4.21 M/uL (ref 3.60–5.20)
RBC: 4 M/uL (ref 3.60–5.20)
RDW-SD: 50.4 — ABNORMAL HIGH (ref 36.4–46.3)
RDW-SD: 49.8 — ABNORMAL HIGH (ref 36.4–46.3)
WBC: 6.8 10*3/uL (ref 4.0–11.0)
WBC: 5.1 10*3/uL (ref 4.0–11.0)

## 2021-10-22 LAB — POCT GLUCOSE
POC Glucose: 44 mg/dL — CL (ref 65–105)
POC Glucose: 71 mg/dL (ref 65–105)

## 2021-10-22 LAB — COMPREHENSIVE METABOLIC PANEL
ALT: 15 U/L (ref 10–49)
AST: 28 U/L (ref 0.0–33.9)
Albumin: 3.8 gm/dl (ref 3.4–5.0)
Alkaline Phosphatase: 180 U/L — ABNORMAL HIGH (ref 46–116)
Anion Gap: 8 mmol/L (ref 5–15)
BUN: 13 mg/dl (ref 9–23)
CO2: 20 mEq/L (ref 20–31)
Calcium: 9.6 mg/dl (ref 8.7–10.4)
Chloride: 114 mEq/L — ABNORMAL HIGH (ref 98–107)
Creatinine: 0.74 mg/dl (ref 0.55–1.02)
EGFR IF NonAfrican American: >60
GFR African American: >60
Glucose: 114 mg/dl — ABNORMAL HIGH (ref 74–106)
Potassium: 4.1 mEq/L (ref 3.5–5.1)
Sodium: 142 mEq/L (ref 136–145)
Total Bilirubin: 0.8 mg/dl (ref 0.30–1.20)
Total Protein: 7.3 gm/dl (ref 5.7–8.2)

## 2021-10-22 LAB — DRUG SCREEN, URINE
Amphetamine: NEGATIVE
Amphetamines: NEGATIVE
Barbiturates: NEGATIVE
Barbiturates: NEGATIVE
Benzodiazapines: NEGATIVE
Benzodiazepines: NEGATIVE
Cocaine: NEGATIVE
Cocaine: NEGATIVE
Marijuana: NEGATIVE
Marijuana: NEGATIVE
Methadone: NEGATIVE
Methadone: NEGATIVE
Opiates: NEGATIVE
Opiates: NEGATIVE
Phencyclidine: NEGATIVE
Phencyclidine: NEGATIVE

## 2021-10-22 LAB — BASIC METABOLIC PANEL
Anion Gap: 9 mmol/L (ref 5–15)
BUN: 11 mg/dl (ref 9–23)
CO2: 18 mEq/L — ABNORMAL LOW (ref 20–31)
Calcium: 9 mg/dl (ref 8.7–10.4)
Chloride: 118 mEq/L — ABNORMAL HIGH (ref 98–107)
Creatinine: 0.58 mg/dl (ref 0.55–1.02)
EGFR IF NonAfrican American: >60
GFR African American: >60
Glucose: 79 mg/dl (ref 74–106)
Potassium: 3 mEq/L — ABNORMAL LOW (ref 3.5–5.1)
Sodium: 145 mEq/L (ref 136–145)

## 2021-10-22 LAB — LIPID PANEL
CHOL/HDL Ratio: 3.5 Ratio (ref 0.0–4.4)
Chol/HDL Ratio: 3.5 Ratio (ref 0.0–4.4)
Cholesterol, Total: 153 mg/dl (ref 0–199)
Cholesterol, total: 153 mg/dl (ref 0–199)
HDL Cholesterol: 44 mg/dl (ref 40–60)
HDL: 44 mg/dl (ref 40–60)
LDL Calculated: 96 mg/dl (ref 0–130)
LDL, calculated: 96 mg/dl (ref 0–130)
Triglyceride: 63 mg/dl (ref 0–150)
Triglycerides: 63 mg/dl (ref 0–150)

## 2021-10-22 LAB — POC URINE MACROSCOPIC
Bilirubin, Urine: NEGATIVE
Bilirubin: NEGATIVE
Blood, Urine: NEGATIVE
Blood: NEGATIVE
Glucose, Ur: NEGATIVE mg/dl
Glucose: NEGATIVE mg/dl
Ketone: NEGATIVE mg/dl
Ketones, Urine: NEGATIVE mg/dl
Leukocyte Esterase, Urine: NEGATIVE
Leukocyte Esterase: NEGATIVE
Nitrite, Urine: NEGATIVE
Nitrites: NEGATIVE
Protein, UA: NEGATIVE mg/dl
Protein: NEGATIVE mg/dl
Specific Gravity, UA: 1.02 (ref 1.005–1.030)
Specific gravity: 1.02 (ref 1.005–1.030)
Urobilinogen, UA, POCT: 1 EU/dl (ref 0.0–1.0)
Urobilinogen: 1 EU/dl (ref 0.0–1.0)
pH (UA): 7 (ref 5–9)
pH, UA: 7 (ref 5–9)

## 2021-10-22 LAB — EKG 12-LEAD
Atrial Rate: 54 {beats}/min
P Axis: 44 degrees
P-R Interval: 164 ms
Q-T Interval: 484 ms
QRS Duration: 88 ms
QTc Calculation (Bazett): 458 ms
R Axis: -8 degrees
T Axis: 37 degrees
Ventricular Rate: 54 {beats}/min

## 2021-10-22 LAB — PROTIME-INR
INR: 1 (ref 0.1–1.1)
Protime: 11.7 seconds (ref 10.2–12.9)

## 2021-10-22 LAB — TROPONIN, HIGH SENSITIVITY
Troponin, High Sensitivity: 13 ng/L (ref 0–34)
Troponin, High Sensitivity: 11 ng/L (ref 0–34)

## 2021-10-22 LAB — HEMOGLOBIN A1C W/O EAG
Hemoglobin A1C: 5.4 % (ref 3.8–5.6)
Hemoglobin A1c: 5.4 % (ref 3.8–5.6)

## 2021-10-22 LAB — MAGNESIUM
Magnesium: 1.8 mg/dL (ref 1.6–2.6)
Magnesium: 1.8 mg/dL (ref 1.6–2.6)

## 2021-10-22 LAB — CBC WITH AUTOMATED DIFF
BASOPHILS: 0.6 % (ref 0–3)
BASOPHILS: 0.8 % (ref 0–3)
EOSINOPHILS: 2.5 % (ref 0–5)
EOSINOPHILS: 3.1 % (ref 0–5)
HCT: 41 % (ref 37.0–50.0)
HCT: 38.7 % (ref 37.0–50.0)
HGB: 13.2 gm/dl (ref 13.0–17.2)
HGB: 12.6 gm/dl — ABNORMAL LOW (ref 13.0–17.2)
IMMATURE GRANULOCYTES: 0.1 % (ref 0.0–3.0)
IMMATURE GRANULOCYTES: 0.2 % (ref 0.0–3.0)
LYMPHOCYTES: 37.7 % (ref 28–48)
LYMPHOCYTES: 36 % (ref 28–48)
MCH: 31.4 pg (ref 25.4–34.6)
MCH: 31.5 pg (ref 25.4–34.6)
MCHC: 32.2 gm/dl (ref 30.0–36.0)
MCHC: 32.6 gm/dl (ref 30.0–36.0)
MCV: 97.4 fL (ref 80.0–98.0)
MCV: 96.8 fL (ref 80.0–98.0)
MONOCYTES: 8.7 % (ref 1–13)
MONOCYTES: 9.9 % (ref 1–13)
MPV: 11.2 fL — ABNORMAL HIGH (ref 6.0–10.0)
MPV: 10.4 fL — ABNORMAL HIGH (ref 6.0–10.0)
NEUTROPHILS: 50.4 % (ref 34–64)
NEUTROPHILS: 50 % (ref 34–64)
NRBC: 0 (ref 0–0)
NRBC: 0 (ref 0–0)
PLATELET: 230 10*3/uL (ref 140–450)
PLATELET: 252 10*3/uL (ref 140–450)
RBC: 4.21 M/uL (ref 3.60–5.20)
RBC: 4 M/uL (ref 3.60–5.20)
RDW-SD: 50.4 — ABNORMAL HIGH (ref 36.4–46.3)
RDW-SD: 49.8 — ABNORMAL HIGH (ref 36.4–46.3)
WBC: 6.8 10*3/uL (ref 4.0–11.0)
WBC: 5.1 10*3/uL (ref 4.0–11.0)

## 2021-10-22 LAB — EKG, 12 LEAD, INITIAL
Atrial Rate: 54 {beats}/min
Calculated P Axis: 44 degrees
Calculated R Axis: -8 degrees
Calculated T Axis: 37 degrees
P-R Interval: 164 ms
Q-T Interval: 484 ms
QRS Duration: 88 ms
QTC Calculation (Bezet): 458 ms
Ventricular Rate: 54 {beats}/min

## 2021-10-22 LAB — METABOLIC PANEL, COMPREHENSIVE
ALT (SGPT): 15 U/L (ref 10–49)
AST (SGOT): 28 U/L (ref 0.0–33.9)
Albumin: 3.8 gm/dl (ref 3.4–5.0)
Alk. phosphatase: 180 U/L — ABNORMAL HIGH (ref 46–116)
Anion gap: 8 mmol/L (ref 5–15)
BUN: 13 mg/dl (ref 9–23)
Bilirubin, total: 0.8 mg/dl (ref 0.30–1.20)
CO2: 20 mEq/L (ref 20–31)
Calcium: 9.6 mg/dl (ref 8.7–10.4)
Chloride: 114 mEq/L — ABNORMAL HIGH (ref 98–107)
Creatinine: 0.74 mg/dl (ref 0.55–1.02)
GFR est AA: >60
GFR est non-AA: >60
Glucose: 114 mg/dl — ABNORMAL HIGH (ref 74–106)
Potassium: 4.1 mEq/L (ref 3.5–5.1)
Protein, total: 7.3 gm/dl (ref 5.7–8.2)
Sodium: 142 mEq/L (ref 136–145)

## 2021-10-22 LAB — PROTHROMBIN TIME + INR
INR: 1 (ref 0.1–1.1)
Prothrombin time: 11.7 seconds (ref 10.2–12.9)

## 2021-10-22 LAB — METABOLIC PANEL, BASIC
Anion gap: 9 mmol/L (ref 5–15)
BUN: 11 mg/dl (ref 9–23)
CO2: 18 mEq/L — ABNORMAL LOW (ref 20–31)
Calcium: 9 mg/dl (ref 8.7–10.4)
Chloride: 118 mEq/L — ABNORMAL HIGH (ref 98–107)
Creatinine: 0.58 mg/dl (ref 0.55–1.02)
GFR est AA: >60
GFR est non-AA: >60
Glucose: 79 mg/dl (ref 74–106)
Potassium: 3 mEq/L — ABNORMAL LOW (ref 3.5–5.1)
Sodium: 145 mEq/L (ref 136–145)

## 2021-10-22 LAB — SARS-COV-2_FLU_RSV
COVID-19: NEGATIVE
Influenza A PCR: NEGATIVE
Influenza B PCR: NEGATIVE
RSV by PCR: NEGATIVE

## 2021-10-22 LAB — GLUCOSE, POC
Glucose (POC): 44 mg/dL — CL (ref 65–105)
Glucose (POC): 71 mg/dL (ref 65–105)

## 2021-10-22 LAB — TROPONIN-HIGH SENSITIVITY
Troponin-High Sensitivity: 13 ng/L (ref 0–34)
Troponin-High Sensitivity: 11 ng/L (ref 0–34)

## 2021-10-22 MED ORDER — OMEPRAZOLE 20 MG CAP, DELAYED RELEASE
20 mg | Freq: Every day | ORAL | Status: DC
Start: 2021-10-22 — End: 2021-10-23
  Administered 2021-10-22: 15:00:00 via ORAL

## 2021-10-22 MED ORDER — POTASSIUM CHLORIDE SR 10 MEQ TAB
10 mEq | Freq: Three times a day (TID) | ORAL | Status: DC
Start: 2021-10-22 — End: 2021-10-22
  Administered 2021-10-22: 16:00:00 via ORAL

## 2021-10-22 MED ORDER — POTASSIUM CHLORIDE SR 10 MEQ TAB
10 mEq | Freq: Three times a day (TID) | ORAL | Status: DC
Start: 2021-10-22 — End: 2021-10-23
  Administered 2021-10-22 – 2021-10-23 (×2): via ORAL

## 2021-10-22 MED ORDER — ATORVASTATIN 40 MG TAB
40 mg | Freq: Every evening | ORAL | Status: DC
Start: 2021-10-22 — End: 2021-10-23
  Administered 2021-10-22 – 2021-10-23 (×2): via ORAL

## 2021-10-22 MED ORDER — PNEUMOCOCCAL 13-VAL CONJ VACCINE-DIP CRM (PF) 0.5 ML IM SYRINGE
0.5 mL | INTRAMUSCULAR | Status: AC
Start: 2021-10-22 — End: 2021-10-23
  Administered 2021-10-23: 16:00:00 via INTRAMUSCULAR

## 2021-10-22 MED ORDER — AMLODIPINE 5 MG TAB
5 mg | Freq: Every day | ORAL | Status: AC
Start: 2021-10-22 — End: 2021-10-23
  Administered 2021-10-22: 15:00:00 via ORAL

## 2021-10-22 MED ORDER — ACETAMINOPHEN 325 MG TABLET
325 mg | Freq: Four times a day (QID) | ORAL | Status: AC | PRN
Start: 2021-10-22 — End: 2021-10-23

## 2021-10-22 MED ORDER — HYDROCODONE-ACETAMINOPHEN 5 MG-325 MG TAB
5-325 mg | ORAL | Status: AC | PRN
Start: 2021-10-22 — End: 2021-10-23

## 2021-10-22 MED ORDER — MEMANTINE 5 MG TAB
5 mg | Freq: Every evening | ORAL | Status: AC
Start: 2021-10-22 — End: 2021-10-23
  Administered 2021-10-22 – 2021-10-23 (×2): via ORAL

## 2021-10-22 MED ORDER — SODIUM CHLORIDE 0.9 % IJ SYRG
Freq: Three times a day (TID) | INTRAMUSCULAR | Status: AC
Start: 2021-10-22 — End: 2021-10-23
  Administered 2021-10-22 – 2021-10-23 (×5): via INTRAVENOUS

## 2021-10-22 MED ORDER — ACETAZOLAMIDE 250 MG TAB
250 mg | Freq: Two times a day (BID) | ORAL | Status: AC
Start: 2021-10-22 — End: 2021-10-23
  Administered 2021-10-22 – 2021-10-23 (×3): via ORAL

## 2021-10-22 MED ORDER — SODIUM CHLORIDE 0.9 % IJ SYRG
INTRAMUSCULAR | Status: AC | PRN
Start: 2021-10-22 — End: 2021-10-23

## 2021-10-22 MED ORDER — NALOXONE 0.4 MG/ML INJECTION
0.4 mg/mL | INTRAMUSCULAR | Status: DC | PRN
Start: 2021-10-22 — End: 2021-10-23

## 2021-10-22 MED ORDER — POTASSIUM CHLORIDE SR 10 MEQ TAB
10 mEq | ORAL | Status: AC
Start: 2021-10-22 — End: 2021-10-22
  Administered 2021-10-22: 16:00:00 via ORAL

## 2021-10-22 MED ORDER — ALBUTEROL SULFATE 0.083 % (0.83 MG/ML) SOLN FOR INHALATION
2.530.083 mg /3 mL (0.083 %) | Freq: Four times a day (QID) | RESPIRATORY_TRACT | Status: DC | PRN
Start: 2021-10-22 — End: 2021-10-23

## 2021-10-22 MED ORDER — ALUM-MAG HYDROXIDE-SIMETH 200 MG-200 MG-20 MG/5 ML ORAL SUSP
200-200-205 mg/5 mL | ORAL | Status: DC | PRN
Start: 2021-10-22 — End: 2021-10-23

## 2021-10-22 MED ORDER — ASPIRIN 81 MG CHEWABLE TAB
81 mg | Freq: Every day | ORAL | Status: AC
Start: 2021-10-22 — End: 2021-10-23
  Administered 2021-10-22: 15:00:00 via ORAL

## 2021-10-22 MED ORDER — HEPARIN (PORCINE) 5,000 UNIT/ML IJ SOLN
5000 unit/mL | Freq: Three times a day (TID) | INTRAMUSCULAR | Status: DC
Start: 2021-10-22 — End: 2021-10-23
  Administered 2021-10-22 – 2021-10-23 (×4): via SUBCUTANEOUS

## 2021-10-22 MED ORDER — DONEPEZIL 10 MG TAB
10 mg | Freq: Every day | ORAL | Status: DC
Start: 2021-10-22 — End: 2021-10-23
  Administered 2021-10-22: 15:00:00 via ORAL

## 2021-10-22 MED FILL — ASPIRIN 81 MG CHEWABLE TAB: 81 mg | ORAL | Qty: 1

## 2021-10-22 MED FILL — POTASSIUM CHLORIDE SR 10 MEQ TAB: 10 mEq | ORAL | Qty: 1

## 2021-10-22 MED FILL — ATORVASTATIN 40 MG TAB: 40 mg | ORAL | Qty: 1

## 2021-10-22 MED FILL — HEPARIN (PORCINE) 5,000 UNIT/ML IJ SOLN: 5000 unit/mL | INTRAMUSCULAR | Qty: 1

## 2021-10-22 MED FILL — ACETAZOLAMIDE 250 MG TAB: 250 mg | ORAL | Qty: 1

## 2021-10-22 MED FILL — AMLODIPINE 5 MG TAB: 5 mg | ORAL | Qty: 1

## 2021-10-22 MED FILL — DONEPEZIL 10 MG TAB: 10 mg | ORAL | Qty: 1

## 2021-10-22 MED FILL — MEMANTINE 5 MG TAB: 5 mg | ORAL | Qty: 2

## 2021-10-22 MED FILL — SODIUM CHLORIDE 0.9 % IJ SYRG: INTRAMUSCULAR | Qty: 10

## 2021-10-22 MED FILL — POTASSIUM CHLORIDE SR 10 MEQ TAB: 10 mEq | ORAL | Qty: 4

## 2021-10-22 MED FILL — OMEPRAZOLE 20 MG CAP, DELAYED RELEASE: 20 mg | ORAL | Qty: 2

## 2021-10-22 NOTE — ED Notes (Signed)
Pt stated she needed to urinate. Reminded patient that she had a pure wick in place and is able to urinate at this time.     Call bell in reach

## 2021-10-22 NOTE — Procedures (Signed)
[facility]  Electroencephalogram  NAME:  Weinfeld, Sebastian  DATE: 10/22/2021  EEG#:   DOB: 01-Mar-1936  MR#    [id number]  ROOM:  5DGU  ACCT#  [id number]  SEX:   F  REFERRING PHYSICIAN: Imogene Burn      cc: Ronna Polio MD;   Roderic Scarce MD      REFERRING PHYSICIAN:    Dr. Corinda Gubler.     HISTORY:    An 85 year old female with right sided weakness and blindness coming with speech disturbance and confusion.     REPORT:    A digitized EEG data recorded using the 10/20 system.  A channel also dedicated to EKG.  Background activity diffusely slow in the theta range with rare alpha rhythm.  Background symmetric.  No epileptic activity seen.  Temporal muscle artifacts identified.  Hyperventilation not performed.  Photic stimulation produced minimal to no driving.  No seizure activity seen.     IMPRESSION:    Mildly abnormal EEG because of diffuse slowness in the background activity consistent with encephalopathy or dementia.  No seizure activity seen.      ___________________  Shelia Media MD   Dictated [id number] A. Bryan Omura, MD  SB  D: 10/22/2021 11:56:21  T: 10/22/2021 12:11:36  [id number]

## 2021-10-22 NOTE — Progress Notes (Signed)
Problem: Dysphagia (Adult)  Goal: *Acute Goals and Plan of Care (Insert Text)  Description: Patient will:  1. Participate in training and education related to SLP, swallow eval with diet recs, compensatory strategies, and aspiration precautions -- Goal Met 10/22/2021     Outcome: Resolved/Met

## 2021-10-22 NOTE — ED Notes (Signed)
 TRANSFER - OUT REPORT:    Verbal report given to Margaret,RN(name) on Amy Sharp  being transferred to 6601(unit) for change in patient condition(Speech disturbace)       Report consisted of patient's Situation, Background, Assessment and   Recommendations(SBAR).     Information from the following report(s) SBAR was reviewed with the receiving nurse.    Lines:   Peripheral IV 10/21/21 Left Antecubital (Active)   Site Assessment Clean, dry, & intact 10/21/21 2010   Phlebitis Assessment 0 10/21/21 2010   Infiltration Assessment 0 10/21/21 2010   Dressing Status Clean, dry, & intact 10/21/21 2010   Dressing Type Transparent 10/21/21 2010   Hub Color/Line Status Pink 10/21/21 2010   Action Taken Blood drawn 10/21/21 2010        Opportunity for questions and clarification was provided.      Patient transported with:   Monitor

## 2021-10-22 NOTE — ED Notes (Signed)
2nd Attempt at calling report, no answer at this time

## 2021-10-22 NOTE — ED Notes (Signed)
Pt Awake in bed, in no current distress. Pt has no notable changes to mental status. Pt needs met at this time.     Call bell in reach

## 2021-10-22 NOTE — ED Notes (Signed)
Update pt son of room change. MRI screening completed with Son while on phone.

## 2021-10-22 NOTE — Progress Notes (Signed)
EEG completed.

## 2021-10-22 NOTE — ED Notes (Signed)
Rounded on pt. Pt seemed to be upset and asked why she is here. Updated pt on plan of care and reoriented her to place and situation. Pt understood at this time and became calm. Pt has no needs at this time.     Call bell in reach

## 2021-10-22 NOTE — ED Notes (Signed)
Labs drawn and sent to lab. Pt has no needs at this time .

## 2021-10-22 NOTE — Progress Notes (Signed)
INTERNAL MEDICINE                                                                   Daily Progress Note    Patient:  Amy Sharp  Today date: October 22, 2021  Date of Admission:  10/21/2021    Interval History and Events of the last 24 hours:  Change mental status       Assessment and Plan:       Patient seen in follow up for multiple medical problems as listed below :    Patient seen in follow up for multiple medical problems as listed below :  Suspct TIA  Hx of  lacunar infarct in the left corona radiata to lentiform in 03/2021  Cont ASA, high dose statin   Neuro check  MRI brian :  EEG  PT/OT/ST   HH recommended  ASA and Statin       Hypokalemia  K replacement  Check Mag level        DM2   Diabetes diet   hemoglobin A1c5.1  Actos was  hold since April of this year     Hypertension uncontrolled  On Norvasc 5 mg daily  Monitor BP at home  F/U with PCP to adjust dose     HLD  Cont high intensity statin        Dementia  Mental status at baseline  Continue Aricept and Mamenda     Sinus bradycardia         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 Discussed with patient and or family regarding management, prognosis, treatment and complications of the patient's medical conditions in detail. All questions answered to the satisfaction of those individual(s) who also verbalized understanding of and agreement with the assessment and plan.     Code Status:  .Full Code     Disposition and Family:   Recommend to continue hospitalization. Discussed with patient and nursing staff.    Anticipated Date of Discharge: 1-2 days    Anticipated Disposition (home, SNF) : TBD      ROS     As H&P and Above  Labwork and Ancillary Studies     All labs/tests/imaging reviewed.Spoke with the nurse regarding patient issues.    Labwork:    CBC  w/Diff   Lab Results   Component Value Date/Time    WBC 5.1 10/22/2021 04:10 AM    HGB 12.6 (L) 10/22/2021 04:10 AM    HCT 38.7 10/22/2021 04:10 AM    PLATELET 252 10/22/2021 04:10 AM    MCV 96.8 10/22/2021 04:10 AM        Basic Metabolic Profile   Lab Results   Component Value Date/Time    Sodium 145 10/22/2021 04:10 AM    Potassium 3.0 (L) 10/22/2021 04:10 AM    Chloride 118 (H) 10/22/2021 04:10 AM    CO2 18 (L) 10/22/2021 04:10 AM    Anion gap 9 10/22/2021 04:10 AM    Glucose 79 10/22/2021 04:10 AM    BUN 11 10/22/2021 04:10 AM    Creatinine 0.58 10/22/2021 04:10 AM  GFR est AA >60.0 10/22/2021 04:10 AM    GFR est non-AA >60 10/22/2021 04:10 AM    Calcium 9.0 10/22/2021 04:10 AM        Cardiac Enzymes   No results found for: CPK, RCK1, RCK2, RCK3, RCK4, CKNDX, CKND1, TROPT, TROIQ, BNPP, BNP   Arterial Blood Gases   No results found for: PH, PHI, PCO2, PCO2I, PO2, PO2I, HCO3, HCO3I, FIO2, FIO2I   Coagulation      Lab Results   Component Value Date/Time    PTP 11.7 10/21/2021 08:05 PM    INR 1.0 10/21/2021 08:05 PM      Hepatic Function   Lab Results   Component Value Date/Time    Alk. phosphatase 180 (H) 10/21/2021 08:05 PM            XR Results (most recent):  Results from Hospital Encounter encounter on 10/21/21    XR CHEST SNGL V    Narrative  INDICATION: Stroke. Dyspnea  COMPARISON: 09/07/2021  TECHNIQUE: Single view chest.    Impression  IMPRESSION:  Cardiomegaly. Mild bibasilar linear atelectatic changes. Mild vascular  congestion. No acute infiltrate.    Electronically signed by: Axel Filler, MD 10/21/2021 9:12 PM EST      MRI Results (most recent):  Results from [place] encounter on 09/07/21    MRI BRAIN WO CONT    Narrative  MRI of the brain    INDICATION: Right-sided weakness.  COMPARISON: No relevant studies.    TECHNIQUE: Multiplanar, multisequence MRI of the brain was performed without  intravenous contrast.    FINDINGS:    Images are degraded by patient motion.    There is no restricted  diffusion to suggest an acute infarct.    There is no mass, midline shift, intracranial hemorrhage, hydrocephalus, or  extra-axial fluid collection. Corresponding to CT abnormality, there is focal  encephalomalacia within the medial aspect of the anterior left frontal lobe with  volume loss compatible with chronic infarct. There is a chronic subcentimeter  left subinsular lacunar infarct. There are moderate chronic microvascular  ischemic changes with moderate generalized cortical atrophy.    There is a partial empty sella configuration, anatomic variant.    The visualized portions of the paranasal sinuses and mastoid air cells appear  well aerated.    The major proximal flow voids appear patent.    Impression  IMPRESSION:    1.  No acute intracranial findings.  2.  Chronic left frontal lobe infarct.  3.  Moderate chronic microvascular ischemic changes with moderate cortical  atrophy.    Electronically signed by: Wallene Dales, MD 09/08/2021 11:37 AM EDT      CT Results (most recent):  Results from [place] encounter on 10/21/21    CT HEAD WO CONT    Narrative  INDICATION: speech disturbance. Slurred speech, stroke alert  COMPARISON: Brain MRI 09/08/2021, head CT dated 09/07/2021  TECHNIQUE: Axial Head CT without IV contrast. Sagittal and coronal  reconstructions were performed. All CT exams at this facility use one or more  dose reduction techniques including automatic exposure control, mA/kV adjustment  per patient's size, or iterative reconstruction technique.    FINDINGS:  Some beam hardening artifact obscures detail in the posterior fossa.    Volume: Age-related cerebral atrophy.    Acute Ischemia: None.    Chronic Ischemia: Chronic small vessel ischemic changes, moderately advanced for  patient age. Chronic left frontal lobe infarction. Chronic lacunar infarctions  involving the bilateral basal ganglia.  Mass: None    Hemorrhage: No acute intracranial hemorrhage.    Ventricles: No  hydrocephalus.    Major arteries, dural sinuses: No hyperdensity to suggest thrombosis.    Calvarium: Intact.    Other discussion: Hyperostosis frontalis of the calvarium.      ASPECTS SCORE: 10    Impression  IMPRESSION:  1.  No acute intracranial abnormalities.    2.  Chronic ischemic changes.    3.  If there is strong clinical suspicion for acute infarction, recommend  follow-up with MRI brain.      Electronically signed by: Axel Filler, MD 10/21/2021 8:06 PM EST        Current Inpatient Meds and Allergies     Medications:  Current Facility-Administered Medications   Medication Dose Route Frequency    pneumococcal 13 val conj dip (PREVNAR-13) injection 0.5 mL  0.5 mL IntraMUSCular PRIOR TO DISCHARGE    acetaZOLAMIDE (DIAMOX) tablet 250 mg  250 mg Oral BID    amLODIPine (NORVASC) tablet 5 mg  5 mg Oral DAILY    aspirin chewable tablet 81 mg  81 mg Oral DAILY    atorvastatin (LIPITOR) tablet 40 mg  40 mg Oral QHS    donepeziL (ARICEPT) tablet 10 mg  10 mg Oral DAILY    memantine (NAMENDA) tablet 10 mg  10 mg Oral QHS    omeprazole (PRILOSEC) capsule 40 mg  40 mg Oral DAILY    sodium chloride (NS) flush 5-10 mL  5-10 mL IntraVENous Q8H    sodium chloride (NS) flush 5-10 mL  5-10 mL IntraVENous PRN    naloxone (NARCAN) injection 0.1 mg  0.1 mg IntraVENous PRN    acetaminophen (TYLENOL) tablet 650 mg  650 mg Oral Q6H PRN    HYDROcodone-acetaminophen (NORCO) 5-325 mg per tablet 1 Tablet  1 Tablet Oral Q4H PRN    alum-mag hydroxide-simeth (MYLANTA) oral suspension 30 mL  30 mL Oral Q4H PRN    albuterol (PROVENTIL VENTOLIN) nebulizer solution 2.5 mg  2.5 mg Nebulization Q6H PRN    heparin (porcine) injection 5,000 Units  5,000 Units SubCUTAneous Q8H          Objective:       Visit Vitals  BP (!) 178/77 (BP 1 Location: Left upper arm, BP Patient Position: Semi fowlers) Comment: Margaret informed   Pulse 60   Temp 97.5 ??F (36.4 ??C)   Resp 16   Ht 5' 6" (1.676 m)   Wt 68.9 kg (152 lb)   SpO2 100%   BMI 24.53 kg/m??     Body  mass index is 24.53 kg/m??.    No intake or output data in the 24 hours ending 10/22/21 8416    General:   Alert, cooperative, no distress, appears stated age.    Lungs:    Clear, no  wheeze, rhonchi, rales   Chest wall:   No tenderness or deformity.    Heart:   Regular rate and rhythm, S1, S2 normal, no murmur, click, rub or gallop.    Abdomen:    Soft, non-tender. Bowel sounds normal. No masses,  No organomegaly.        Musculoskeleta : Normal range of motion in most of the joints   Extremities:  Extremities normal, atraumatic, no cyanosis or edema.    Pulses:  2+ and symmetric all extremities.    Skin:  Skin color, texture, turgor normal. No rashes or lesions    Neurologic  Psych:  : CNII-XII intact.  Normal affect and mood . No thoughts of harm to self or others              Total time spent with chart review, patient examination/education, discussion with staff on case,documentation and medication management / adjustment:   >35 minutes    It is always a pleasure to be involved in the clinical care of this patient.    Insurance risk surveyor medical dictation software was used for portions of this report.  Unintended voice transcription errors may have occurred.)    Otila Kluver, MD  October 22, 2021  [facility] Internal Medicine  Pager:  [phone]

## 2021-10-22 NOTE — Progress Notes (Signed)
Patient admitted on 10/21/2021 from home with   Chief Complaint   Patient presents with    Aphasia          The patient is being treated for    PMH:   Past Medical History:   Diagnosis Date    Dementia ([place])     Hypertension     Stroke [facility])         Treatment Team: Treatment Team: Attending Provider: Otila Kluver, MD; Consulting Provider: Other, Phys, MD; Consulting Provider: Charlaine Dalton, MD; Consulting Provider: Otila Kluver, MD; Speech Language Pathologist: Zada Finders, SLP; Occupational Therapist: Rhunette Croft, OT; Occupational Therapist: Ofilia Neas; Physical Therapist: Michaelle Birks, PT      The patient has been admitted to the hospital 1 times in the past 12 months.    Previous 4 Admission Dates Admission and Discharge Diagnosis Interventions Barriers Disposition                                 Patient and Family/Caregivers Goals of Care: improved     Caregivers Participating in Plan of Care/Discharge Plan with the patient: son , pt , CM     Tentative dc plan: home     Anticipated DME needs for discharge:      PRESCREENING COMPLETED FOR SNF tbd *  Will patient qualify for medicaid now or in the next 180 days? Possible      Has patient had covid vaccine? Y/N   Date and type if not in chart under immunization field yes 's 2    Does patient have an ACP? No          Does the patient have appropriate clothing available to be worn at discharge? yes      Anticipated Discharge Date: tbd      Barriers to Healthcare Success/ Readmission Risk Factors: na    Consults:  Palliative Care Consult Recommended: no  Transitional Care Clinic Referral:  no  Transitional Nurse Navigator Referral: no  Oncology Navigator Referral: no  SW consulted: no  [facility] Consulted: no  Outside [employer] Referrals and Collaboration: no    Food/Nutrition Needs:   no                   Dietician Consulted: no    RRAT Score: Medium Risk            14 Total Score    4 IP Visits  Last 12 Months (1-3=4, 4=9, >4=11)    5 Pt. Coverage (Medicare=5 , Medicaid, or Self-Pay=4)    5 Charlson Comorbidity Score (Age + Comorbid Conditions)        Criteria that do not apply:    Has Seen PCP in Last 6 Months (Yes=3, No=0)    Married. Living with Significant Other. Assisted Living. LTAC. SNF. or   Rehab    Patient Length of Stay (>5 days = 3)           PCP: Mariann Barter, NP . How do you get to your doctor appointmentno    Specialists: no    Dialysis Unit: no    Pharmacy:   name [facility]    [place]   Are there any medications that you have trouble paying for no  Any difficulty getting your medication  no    DME available at Home: cane, walker  Home O2 L Flow:  no            Home O2 Provider: no    Home Environment and Prior Level of Function: Lives at [place] [address] @HOMEPHONE @. Lives with son and daughter    How many stories is home 1    Steps into home0  Responsibilities at home include  adl's     Prior to admission open services:  no    [place]  no  [place] no    Extended Emergency Contact Information  Primary Emergency Contact: [place] Phone: [phone]  Mobile Phone: [phone]  Relation: Son  Secondary Emergency Contact: [facility] Phone: [phone]  Mobile Phone: [phone]  Relation: Daughter     Transportation:  son  will transport home    Therapy Recommendations:    OT = p    PT = p    SLP =  p     RT Home O2 Evaluation =  n    Wound Care =  n    Case Management Assessment    ABUSE/NEGLECT SCREENING   Physical Abuse/Neglect: Denies   Sexual Abuse: Denies   Sexual Abuse: Denies   Other Abuse/Issues: Denies          PRIMARY DECISION MAKER                                   CARE MANAGEMENT INTERVENTIONS   Readmission Interview Completed: Not Applicable   PCP Verified by CM: Yes       Palliative Care Criteria Met (RRAT>21 & CHF Dx)?: No   Mode of Transport at Discharge: Self        Transition of Care Consult (CM Consult): Discharge Planning           MyChart Signup: Yes   Discharge Durable Medical Equipment: Yes   Physical Therapy Consult: Yes   Occupational Therapy Consult: Yes   Speech Therapy Consult: Yes       Reason for Referral: DCP Rounds   History Provided By: Child/Family (pt w testing)   Patient Orientation: Unable to Assess       Support System Response: Concerned, Cooperative   Previous Living Arrangement: Lives with Family Independent   Home Accessibility: Steps       Current Functional Level: Independent in ADLs/IADLs       Can patient return to prior living arrangement: Yes   Ability to make needs known:: Good                   Types of Needs Identified: Disease Management Education, Treatment Education, ADLs/IADLs, Support System/Social       Confirm Follow Up Transport: Family (son )                  DISCHARGE LOCATION

## 2021-10-22 NOTE — Progress Notes (Signed)
Progress Notes by Kelvin Cellar, PT at 10/22/21 1434                Author: Kelvin Cellar, PT  Service: --  Author Type: Physical Therapist       Filed: 10/22/21 1450  Date of Service: 10/22/21 1434  Status: Signed          Editor: Kelvin Cellar, PT (Physical Therapist)                  PHYSICAL THERAPY EVALUATION:                 Patient: Amy Sharp (85 y.o. female)   Room: 6601/6601   [x]   Patient DOB Verified      Date of Admission: 10/21/2021    Length of Stay:  0 day(s)   Primary Diagnosis: Speech disturbance [R47.9]         Insurance: Payor: BLUE CROSS MEDICARE / Plan: [facility] [employer]UE PLUS / Product Type: Managed Care Medicare /         Date: 10/22/2021   In time: 1:50  Out time:  2:30        Precautions: Falls, (R) eye vision loss.           Patient received/participated in 23 minutes of treatment (bed mobility training, transfer training, gait training, therapeutic exercises, therapeutic activities)  during/immediately following PT evaluation.           Prior Level of Function/Home Situation:         Information was obtained by patient and chart review (patient is poor historian)      Home environment: Pt lives with son and daughter in single story home with 8 STE with handrails.    Prior level of function: Pt ambulated using SPC around home and pt reported in the community; per chart review pt uses WC in community. Pt required  assist with ADLs with family performing meals/housekeeping tasks.    Home equipment: rolling walker, straight cane           Assessment:        Based on the objective data described below, the patient presents with      - decreased independence with bed mobility requiring SBA   - decreased independence with functional transfers requiring CGA/SBA   - decreased independence with ambulation using FWW SBA with cues for obstacle avoidance on (R) side due to visual deficits   - decreased BLE muscle strength   - decreased functional activity tolerance    -  (R)  eye vision loss at baseline (previous CVA)   - pt previously hospitalized 9/30-10/2/22 suspected CVA   - CT head showed no acute intracranial abnormality   - history of bradycardia; HR 72 bpm at rest and 92 bpm s/p gait      Modified Rankin Score (mRS): 3 - Moderate disability; requires some help but able to walk without (human) assistance - cane, walker etc.      Patients rehabilitation potential for below stated goals: Good.           Recommendations:        Recommend continued physical therapy during acute stay. Physical Therapy and Occupational Therapy.  Recommend out of bed activity as tolerated to prevent complications and to promote functional mobility and promote healing.          Disposition:         Discharge Recommendations:  Home with family support and HHPT.            Equipment Recommendations for Discharge:        No DME needs, has DME available at home                  Plan:         Patient will benefit from skilled Physical Therapy intervention to address the above impairments to return to prior level of function. Patient will be seen 1-5 times/week.      Patient will achieve PT goals in 1 week. Goals were created with input from the patient.        Physical Therapy Goals:        - Patient will be modified independent with bed mobility in order to promote OOB activity.     - Patient will be modified independent with transfers in preparation for OOB activities and ambulation.   - Patient will ambulate 350 feet with the least restrictive device to promote functional independence at home, with modified independence.    - Patient will ascend/descend 8 stairs with handrail(s) with with supervision.   - Patient will demonstrate Fair (+) standing balance to safely enable upright activities and reduce risk for falls.   - Patient will be supervision with lower extremity home exercise program to increase strength and endurance to improve functional mobility.        Planned interventions:          Skilled Physical Therapy services will provide functional mobility training, therapeutic exercises, therapeutic activities, patient/caregiver education as indicated.   Skilled Physical Therapy services will modify and progress therapeutic interventions, address functional mobility deficits, address ROM and strength deficits, analyze and cue movement  patterns and assess and modify postural abnormalities to reach the stated goals.        Subjective:         Patient agreeable to participate with therapy. At end of session pt reported that she enjoyed walking and is looking forward to walking more tomorrow.    Patient reports no pain.        Objective Data Summary:         Orders, labs, and chart reviewed on Orlean Patten. Communicated with patient's nurse (patient ok to be seen by PT).       Present illness history:      Patient Active Problem List           Diagnosis  Date Noted         ?  Speech disturbance  10/21/2021     ?  Stroke [facility])  09/07/2021         ?  AMS (altered mental status)  09/07/2021         Previous medical history:      Past Medical History:        Diagnosis  Date         ?  Dementia (HCC)       ?  Hypertension           ?  Stroke [facility])                Patient found:        Semi reclined in bed. (+) bed alarm. (+) telemetry.        Cognitive Status:           Mental Status:        Alert and oriented x 1, cooperative and pleasant  Communication:        grossly intact, occasional stuttering noted during conversation           Follows commands:         interacts appropriately, able to follow simple 1 step commands           General Cognition:         impaired, impaired safety awareness, slow processing           Safety/Judgement:         decreased awareness of need for safety, decreased awareness of environment, decreased awareness of need  for assistance, decreased insight into deficits          Extremities Assessment:            Sensation:        Intact to light touch           Strength:        Grossly 4+/5; except hip flexion 4-/5           Range of Motion:        [facility]          Therapeutic Activities; Functional Mobility and Balance Status:             Bed mobility:   Functional Status  Comments         Supine to sit, HOB slightly raised with use of bed  rails  standby assist   Increased time for trunk management         Sit to supine, bed flat with use of bed rails  standby assist  Increased time for BLE management            Transfers:   Functional Status  Comments         Sit to stand  standby assistance/contact guard assist  Verbal/tactile cues for proper hand placement         Stand to sit  standby assistance/contact guard assist  Verbal/tactile cues for proper hand placement and keeping FWW vs pushing towards side prior to sitting            Gait Training:           Distance   200 feet and 350 feet      Analysis   decreased cadence, decreased step height/length, downward gaze, forward flexed posture, decreased awareness of environment due to (R) sided vision loss      Assistance   standby assistance/contact guard assist and cueing for posture and obstacle avoidance on (R) side         DME   rolling walker, gait belt          Stair training:        not tested           Functional Balance Sitting:        Good dynamic: patient accepts moderate challenge           Functional Balance Standing:        Static standing: fair: maintains balance without assistance with cueing           Functional Outcome Measure:           Functional Status Score for the Intensive Care Unit (FSS-ICU)      Task   Score     1.  Rolling  5 - Requires cues or coaxing, but can perform physically without assistance     2. Supine to Sit Transfer  5 -  Requires cues or coaxing, but can physically perform without assistance     3. Sit to Stand Transfer  5 - Requires cues or coaxing, but can physically perform without assistance (may push up from surface)     4. Sitting Edge of Bed  6 - Requires hands to balance     5. Walking  5 -  Requires only supervision or cues in order to walk 150+ feet (may use assistive device)     TOTAL SCORE:  26/35        INITIAL TOTAL SCORE:                  Therapeutic Exercises:           Therapeutic exercises:        10 reps each, AROM bilateral LE:    ankle pumps, hip ab/duction in sitting, long arc quads (LAQ), seated marches          Activity Tolerance:        Patient presents with:   - good tolerance to activity during PT session   - motivated to increase activity   - reports fatigue after activity   - generalized muscle weakness   - vitals stable   - no apparent distress        Final Location:        Positioned in bed, all needs within reach. Patient agrees to call for assistance. (+) bed alarm. (+) nurse notified.        Communication/Education:           Education:        OOB with assist from staff, call staff for assistance, safety, DME, disposition, role of PT, PT plan of care, all questions answered. Opportunity for questions  and clarification was provided.           Education provided to:        patient           Readiness to learn indicated by:        patient asking questions, patient showing interest, patient needs reinforcement             Barriers to learning/limitations:        Yes;  baseline dementia           Comprehension:        Patient communicated comprehension           Thank you for this referral.   Kelvin Cellar, PT, DPT

## 2021-10-22 NOTE — ED Notes (Signed)
Attempted to call report, no answer at this time.

## 2021-10-22 NOTE — Progress Notes (Signed)
 Progress Notes  by Amy Sharp at 10/22/21 1358                Author: Vannie Sharp  Service: Occupational Therapy  Author Type: Occupational Therapy Student       Filed: 10/22/21 1419  Date of Service: 10/22/21 1358  Status: Attested           Editor: Amy Sharp (Occupational Therapy Student)  Cosigner: Amy Sharp, OT at 10/22/21 1432          Attestation signed by Amy Sharp, OT at 10/22/21 1432          I provided direct line of sight supervision throughout this evaluation and treatment and agree with the below documentation.      Amy Sharp, OTR/L                                   OCCUPATIONAL THERAPY EVALUATION        Patient: Amy Sharp (85 y.o. female)   Room: 6601/6601   Primary Diagnosis: Speech disturbance [R47.9]            Date: 10/22/2021   In time:  11:21        Out time:  11:45   Total Time: 24 min   Evaluation Time: 15 min   Treatment Time: 9 min      Isolation:  There are  currently no Active Isolations       MDRO: No active infections   Precautions: Falls.   ADULT DIET Regular      Orders, labs, and chart reviewed. Communicated with nursing staff. Patient cleared to participate.        ASSESSMENT:        Based on the objective data described below, the patient presents with    - generalized muscle weakness affecting function in ADLs   - decreased functional standing balance     - decreased functional mobility    - unsteady in functional mobility, transfers, and standing   - decreased standing tolerance   - decreased activity tolerance, increased fatigue and/or shortness of breath   - decreased cognition    - decreased safety awareness   - SBA for bed mobility   - CGA Sit <> Stand    - Functional Txfr: bed > commode with MIN A and RW   - R eye vision loss      affecting patient's ability to safely and independently perform basic ADLs/IADLs.      Patient will benefit from skilled occupational therapy intervention to address the above impairments.      Patients  rehabilitation potential is considered to be Good.        Modified Rankin Score (mRS): 3 - Moderate disability; requires some help but able to walk without (human) assistance  - cane, walker etc.            PLAN :   Planned Interventions: ADI training, activity tolerance, functional balance training, functional  mobility training, therapeutic exercise, therapeutic activity, energy conservation, and cognitive training.   Frequency/Duration: Patient to be seen 1-5x/week x 4 weeks.   Discharge Recommendations: Home health OT   Further Equipment Recommendations for Discharge: no needs anticipated; has DME available at home        Patient and/or family have participated as able in goal setting and plan of care.        PRIOR LEVEL OF  FUNCTION:        Information was obtained by:  patient, chart review   Home environment: Patient lives with son and daughter in 2 LVL home.       Prior level of function:Patient requires assistance with basic ADLs.  Patient ambulates with a device.   Prior level of Instrumental Activities of Daily Living: Patient does not perform any IADLs.   Home equipment: unknown        EDUCATION:        Barriers to Learning/Limitations: yes;  cognitive   Education provided: Patient, Role of OT, Benefit of activity while hospitalized, Call for assistance, Staff assistance with mobility, ADL training,  Safety, and Functional mobility.   Educational Handouts issued: NONE.        SUBJECTIVE:     Patient [name], [name] is just fine.   --------------------------------------------------------------------------------------------------   Pain Assessment: None reported   Pain Location:  N/A        OBJECTIVE DATA SUMMARY:        Patient found Bed, Telemetry, and Bed alarm.      Cognition       Mental Status: Oriented to and person.   Communication: grossly intact.   Attention span: fair(15-72min).   Follows commands: 1 step and 2 step.   General Cognition: impaired sequencing, slow processing,  impaired safety, and impaired ST Memory.   Hearing: grossly intact.   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   LB Bathing: contact guard.   UB Dressing: standby assist.   LB Dressing: contact guard; demo'd ability to don/doff socks at EOB   Toileting: standby assist; req'd verbal/visual cues for locating toilet paper on R side d/t visual deficit      ADL Comment:  Pt will likely benefit from set-up assist to increase independence in ADLs. Required verbal/visual/tactile cues throughout tasks in new environment to cope with R vision deficit.       Mobility      Supine to sit: standby assist.   Sit to Supine: standby assist.   Sit to Stand: contact guard.   Functional transfers: bed to commode with  min. assist with RW      Mobility Comment: Pt required verbal/visual cues for walker management.       Functional Balance      Static Sitting Balance: good.   Dynamic Sitting Balance: good-.   Static Standing Balance: fair.   Dynamic Standing Balance: fair-.   Activity Tolerance: fair.      Upper Extremity Function      BUE ROM/Strength: WFL; 4-/5      Final Location: bed, bed alarm, all needs close, and agrees to call for assistance.            Micky Index of Independence in Activities of Daily Living          Activities   Points (1 or 0)  Independence   (1 point)   NO supervision, direction, or personal assistance  Dependence    (0 points)   WITH supervision, direction, personal assistance, or total care     Bathing  []  (  1 POINT)  Bathes self completely or needs help in bathing only a single part of the body such as the back, genital area, or disabled extremity  [x]  (0 POINTS)   Need help with bathing more than one part of the body, getting in or out of the tub or showering. Requires total bathing.      Dressing  []  (1 POINT)  Get clothes from  closets and drawers and puts on clothes and outer garments complete with fasteners. May have help tying shoes.   [x]  (0 POINTS)  Needs help with dressing self or needs to be completely dressed.      Toileting  []  (1 POINT)  Goes to toileting, gets on and off, arranges clothes, cleans genital area without help   [x]  Needs  help transferring to the toileting, cleaning self or uses bedpan or commode      Transferring  []  (1 POINT)  Moves in and out of the bed or chair unassisted. Mechanical transfer aids are acceptable   [x] (0 POINTS)  Needs help in moving from bed to chair or requires a complete transfer     Continence   [x]  (1 POINT)  Exercises complete self control over urination and defecation   []  (0 POINTS)  Is partially or totally incontinent of bowel or bladder     Feeding   [x]  (1 POINT)  Gets food from plate into mouth without help. Preparation of food may be done by another person  []  (0 POINTS)  Needs partial or total help with feeding or requires parenteral feeding.         Total Score = 2             Scoring: 6 = High (patient independent) 0 = Low (patient very dependent)         By Hoy Shutter, PhD, APRN, BC, [facility], and Ronal Corns, PhD, ARNP, [place]  [facility]           Occupational Therapy Goals:     OT goals initiated 10/22/2021 and will be met by patient within 10 sessions       1.  Patient will complete UB dressing/bathing with supervision and set-up assist to increase I in ADLs.   2.  Patient will complete toilet transfer with supervision to increase I in functional transfers.   3.  Patient will complete LB dressing/bathing with supervision and set-up assist to improve ADL function.   4.  Patient will stand x 5 minutes with good- balance to complete grooming tasks.   5.  Patient  will complete toileting tasks with supervision to improve ADL function.     _______________________________________________________________________      Thank you for this referral.      Derrek Finder, OTS

## 2021-10-22 NOTE — Progress Notes (Signed)
 SPEECH LANGUAGE PATHOLOGY   BEDSIDE DYSPHAGIA EVALUATION AND DISCHARGE     Patient: Amy Sharp (85 y.o. female)  Date: 10/22/2021  Primary Diagnosis: Speech disturbance [R47.9]          Isolation:  There are currently no Active Isolations       MDRO: No current active infections  Precautions/ PPE: surgical mask, gloves    Precautions:  universal  PLOF: As per H&P   Start Time:  1224 End Time:  1235      ASSESSMENT:   Orientation: Pt alert and oriented to self only, which is baseline per chart review - pt with dementia; accepting of eval.   Respiratory Status: RA    Evaluation: Noted to be admitted with speech disturbance which appeared to have resolved by time of presentation to hospital per chart review. Pt's speech 100% intelligible and clear. Pt seen for bedside swallow eval. Pt denies difficulty swallowing, reporting regular diet prior to current admission. Oral motor structures, strength, and ROM WFL; grossly intact for mastication and deglutition.. Functional communication. Intelligibility >90%. Cognitive-linguistic function appears intact.  Pt self-feeding PO trials of thin liquids via straw, regular textures. No s/sx of aspiration appreciated across consistencies. Oropharyngeal swallow appears WFL. Swallow appears timely, adequate laryngeal elevation noted via palpation, no change in vocal/resp quality appreciated.  Recommend regular texture diet with thin liquids, HOB ~60* for all po feeds and >45* at least 30 minutes following, meds as tolerated with recommended liquid consistency.  Pt educated on and verbalized understanding of findings, recs, and POC; d/w Charity fundraiser. Skilled ST services not warranted at this time as pt demonstrates functional deglutition at baseline. SLP will sign off accordingly.  Please re-consult should further concerns arise.  Functional Oral Intake Scale: Level 7: Total oral intake without restrictions      PLAN/ RECOMMENDATIONS:     Diet Recommendations: IDDSI 7- regular, IDDSI 0- thin,  HOB ~60* for all po feeds and >45* at least 30 minutes following    No skilled SLP services warranted for dysphagia at this time.  Eval only.  Other Recommended Services: Dietitian    SUBJECTIVE:    Patient stated: I'm not sure.   Pain prior to treatment: pt denied pain   Pain following treatment: pt denied pain     OBJECTIVE:   As above.    Safety: Following session, pt in bed, lowest position, HOB 55*, 3 side rails up, bed alarm on  Modified Rankin Score (MRS): Defer to OT/ PT    EDUCATION:   Education provided: POC and diet recommendations  Individual Educated: patient  Comprehension: pt communicated comprehension, In agreement, and query pt's level carry over   Staff Education: Educated Nurse to diet recommendations.      IMAGING:   CXR Results  (Last 48 hours)                 10/21/21 2050  XR CHEST SNGL V Final result    Impression:  IMPRESSION:   Cardiomegaly. Mild bibasilar linear atelectatic changes. Mild vascular   congestion. No acute infiltrate.       Electronically signed by: Lorraine Skeeter, MD 10/21/2021 9:12 PM EST           Narrative:  INDICATION: Stroke. Dyspnea   COMPARISON: 09/07/2021   TECHNIQUE: Single view chest.                  Results from Hospital Encounter encounter on 10/21/21    CT HEAD WO CONT  Narrative  INDICATION: speech disturbance. Slurred speech, stroke alert  COMPARISON: Brain MRI 09/08/2021, head CT dated 09/07/2021  TECHNIQUE: Axial Head CT without IV contrast. Sagittal and coronal  reconstructions were performed. All CT exams at this facility use one or more  dose reduction techniques including automatic exposure control, mA/kV adjustment  per patient's size, or iterative reconstruction technique.    FINDINGS:  Some beam hardening artifact obscures detail in the posterior fossa.    Volume: Age-related cerebral atrophy.    Acute Ischemia: None.    Chronic Ischemia: Chronic small vessel ischemic changes, moderately advanced for  patient age. Chronic left frontal lobe infarction.  Chronic lacunar infarctions  involving the bilateral basal ganglia.    Mass: None    Hemorrhage: No acute intracranial hemorrhage.    Ventricles: No hydrocephalus.    Major arteries, dural sinuses: No hyperdensity to suggest thrombosis.    Calvarium: Intact.    Other discussion: Hyperostosis frontalis of the calvarium.      ASPECTS SCORE: 10    Impression  IMPRESSION:  1.  No acute intracranial abnormalities.    2.  Chronic ischemic changes.    3.  If there is strong clinical suspicion for acute infarction, recommend  follow-up with MRI brain.      Electronically signed by: Lorraine Skeeter, MD 10/21/2021 8:06 PM EST      No valid procedures specified.      Thank you for this referral,    Maurine Blacksmith, MS, CCC-SLP  Speech-Language Pathologist

## 2021-10-23 LAB — BASIC METABOLIC PANEL
Anion Gap: 10 mmol/L (ref 5–15)
BUN: 11 mg/dl (ref 9–23)
CO2: 18 mEq/L — ABNORMAL LOW (ref 20–31)
Calcium: 9.8 mg/dl (ref 8.7–10.4)
Chloride: 117 mEq/L — ABNORMAL HIGH (ref 98–107)
Creatinine: 0.75 mg/dl (ref 0.55–1.02)
EGFR IF NonAfrican American: >60
GFR African American: >60
Glucose: 104 mg/dl (ref 74–106)
Potassium: 4.2 mEq/L (ref 3.5–5.1)
Sodium: 145 mEq/L (ref 136–145)

## 2021-10-23 LAB — METABOLIC PANEL, BASIC
Anion gap: 10 mmol/L (ref 5–15)
BUN: 11 mg/dL (ref 9–23)
CO2: 18 mEq/L — ABNORMAL LOW (ref 20–31)
Calcium: 9.8 mg/dL (ref 8.7–10.4)
Chloride: 117 mEq/L — ABNORMAL HIGH (ref 98–107)
Creatinine: 0.75 mg/dL (ref 0.55–1.02)
GFR est AA: >60
GFR est non-AA: >60
Glucose: 104 mg/dL (ref 74–106)
Potassium: 4.2 meq/L (ref 3.5–5.1)
Sodium: 145 meq/L (ref 136–145)

## 2021-10-23 MED ORDER — ACETAMINOPHEN 325 MG TABLET
325 mg | ORAL_TABLET | Freq: Four times a day (QID) | ORAL | 0 refills | Status: AC | PRN
Start: 2021-10-23 — End: ?

## 2021-10-23 MED ORDER — POTASSIUM CHLORIDE SR 10 MEQ TAB
10 mEq | ORAL_TABLET | Freq: Every day | ORAL | 0 refills | Status: AC
Start: 2021-10-23 — End: ?

## 2021-10-23 MED FILL — AMLODIPINE 5 MG TAB: 5 mg | ORAL | Qty: 1

## 2021-10-23 MED FILL — HEPARIN (PORCINE) 5,000 UNIT/ML IJ SOLN: 5000 unit/mL | INTRAMUSCULAR | Qty: 1

## 2021-10-23 MED FILL — ATORVASTATIN 40 MG TAB: 40 mg | ORAL | Qty: 1

## 2021-10-23 MED FILL — POTASSIUM CHLORIDE SR 10 MEQ TAB: 10 mEq | ORAL | Qty: 1

## 2021-10-23 MED FILL — MEMANTINE 5 MG TAB: 5 mg | ORAL | Qty: 2

## 2021-10-23 MED FILL — OMEPRAZOLE 20 MG CAP, DELAYED RELEASE: 20 mg | ORAL | Qty: 2

## 2021-10-23 MED FILL — DONEPEZIL 10 MG TAB: 10 mg | ORAL | Qty: 1

## 2021-10-23 MED FILL — PREVNAR 13 (PF) 0.5 ML INTRAMUSCULAR SYRINGE: 0.5 mL | INTRAMUSCULAR | Qty: 0.5

## 2021-10-23 MED FILL — ASPIRIN 81 MG CHEWABLE TAB: 81 mg | ORAL | Qty: 1

## 2021-10-23 MED FILL — ACETAZOLAMIDE 250 MG TAB: 250 mg | ORAL | Qty: 1

## 2021-10-23 NOTE — Progress Notes (Signed)
Pt son states pt has not received  pneumococcal vaccine. Pt and son agreeable for pt to receive vaccine.

## 2021-10-23 NOTE — Progress Notes (Signed)
 OCCUPATIONAL THERAPY TREATMENT       Patient: Amy Sharp (85 y.o. female)  Room: 6601/6601    Primary Diagnosis: Speech disturbance [R47.9]  TIA (transient ischemic attack) [G45.9]  Hypokalemia [E87.6]       Date of Admission: 10/21/2021   Length of Stay:  1 day(s)  Insurance: Payor: BLUE CROSS MEDICARE / Plan: [facility] [employer] PLUS / Product Type: Managed Care Medicare /      Date: 10/23/2021  In time:  0912        Out time:  1016    Precautions: Falls   Ordered Weight Bearing Status: None    Isolation:  There are currently no Active Isolations       MDRO: No active infections       Occupational Therapy Goals:   OT goals initiated 10/22/2021 and will be met by patient within 10 sessions      Patient will complete UB dressing/bathing with supervision and set-up assist to increase I in ADLs.  Patient will complete toilet transfer with supervision to increase I in functional transfers.  Patient will complete LB dressing/bathing with supervision and set-up assist to improve ADL function.  Patient will stand x 5 minutes with good- balance to complete grooming tasks.  Patient will complete toileting tasks with supervision to improve ADL function.  ASSESSMENT:    Based on the objective data described below, the patient presents with     -observed R shoulder hike during ADL tasks, yet functional for ADL performance in UB bathing  -pt has no c/o pain  -pt required multi modal cueing for FWW management within room and in hallway for fall prevention  -pt hitting wall on L side using FWW during functional mobility  -education provided for scanning environment  -demo'd limited cervical rotation for scanning to right  -pt frequently overshoots during BADL tasks with objects placed directly center, slightly less overshooting when items placed to pt Left side  -pt improving on established OT goals  -pt high fall risk       PLAN:   ADI training, activity tolerance, functional balance training, functional  mobility training, therapeutic exercise, therapeutic activity, energy conservation, and cognitive training.  Frequency/Duration: Patient to be seen 1-5x/week x 4 weeks.  Recommendations:  Recommend continued skilled occupational therapy intervention to address above impairments.  Recommend out of bed activity to counteract ill effects of bedrest, with assistance from staff as needed.    Discharge Recommendations: HHOT, would recommend 24-7 assist for ADL supervision and safety to decrease fall risk during tasks and functional mobility within home  Further Equipment Recommendations for Discharge: no needs anticipated, pt has equipment available at home     Education/ communication:     Barriers to Learning/Limitations:  yes;  emotional and cognitive, sensory: blind in Right eye  Education provided to: patient on (+) role of OT, (+) OT plan of care, (+) Instructed patient in the benefits of maintaining activity tolerance, functional mobility, and independence with self care tasks during acute stay  to ensure safe return home and to baseline. Encouraged patient to increase frequency and duration OOB, be out of bed for all meals, perform daily ADLs (as approved by RN/MD regarding bathing etc), and performing functional mobility to/from bathroom with staff assistance as needed., (+) instructed patient on the importance of activity while hospitalized to prevent a decline in function, (+) the importance of maintaining UE muscle strength and activity tolerance while hospitalized to prevent a decline in function, (+) staff  assistance with mobility, (+) change positions frequently, (+) functional mobility, (+) ADL training, (+) adaptive equipment use, (+) safety, importance of hydration  Educational Handouts issued: none this session  Patient / Family readiness to learn indicated by: showing interest, trying to perform skills, needs reinforcement  Therapist communicated with nursing prior to session Joelyn, RN) pt cleared  for therapy  SUBJECTIVE:   Patient Ora Brasil used to play piano, I'd cry.    OBJECTIVE DATA SUMMARY:   Orders, labs, occupational therapy and chart reviewed on [facility]. Communicated with nursing staff. Patient cleared to participate in Occupational Therapy treatment.    Most recent value for oxygen in flow sheets:  O2 Device: None (Room air) (10/23/21 0000)        Patient found: supine, breakfast tray on tray table, 50% eaten, liquids untouched, emotional, bed alarm (+), remote sitter (+), telemetry (+)    Pain assessment: 0/10 reported    Cognitive:   Mental Status: Oriented to person.  Communication: grossly intact.  Attention span: fair(15-23min).  Follows commands: 1 step and 2 step with verbal and tactile cueing.  General Cognition: impaired sequencing, slow processing, impaired safety, and impaired ST Memory.  Hearing: grossly intact.  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    Activities of Daily Living:  Drinking: setup to bring cup to mouth  UB bathing: Seated EOB, with setup and verbal/tactile cueing for location of items, increased time   LB bathing: seated EOB and in stance, CGA overall  UBD: Setup at EOB with tactile cueing for BUE placement  LBD: Seated EOB with setup and increased time  Oral care: in stance at sink with SBA, verbal and tactile cueing for location of items  Toileting: SBA, verbal/tactile cueing for toileting hygiene initiation  Mobility:  Bed mobility: supine to EOB sit: CGA and increased time for processing request, tactile cueing to initiate  EOB sit to Stand: CGA, tactile and verbal cueing provided for safety, sequencing using FWW  Stand to sit: CGA with verbal cueing  Functional mobility within room using FWW, pt required multi modal cueing for safety, pt tends to carry FWW in turns, this date pt bumping into wall on Left side without cueing for scanning environment  -pt required frequent multi modal cueing for all transfers for safety and sequencing for hand  placement using FWW and toilet grab bars  Balance:  Static/Dynamic sit:  Good at EOB, no LOB noted, pt able to perform low anterior reaching without LOB  Static stand:  Fair overall.  Pt required unilateral support and CGA when reaching outside BOS  Dynamic stand: fair -    Activity Tolerance:   Pt required seated rest break, emotional when listening to classical music during ADL tasks    Therapeutic Exercises: completed to promote pt (I) in self care tasks  Seated dynamic reaching 2 sets x 8-10 reps to target, R shoulder hike observed, tactile cueing provided for sequencing  Cervical rotation in stance with BUE support progressing to unilateral support: 2 sets x 8 reps with SBA  Shoulder flexion while seated 2 sets x 8-10 reps  Trunk rotations in stance 2 sets x 8 reps with CGA      Final Location: Pt directly handed off to DPT for session, no c/o pain    Olam Gaskins, OTA  October 23, 2021

## 2021-10-23 NOTE — Progress Notes (Signed)
Discharge instructions reviewed with pt's son and daughter. Both verbalize understanding and deny any questions at this time.

## 2021-10-23 NOTE — Progress Notes (Signed)
 physical Therapy Treatment    Patient: Amy Sharp (85 y.o. female)  Room: 6601/6601    Date: 10/23/2021  Start Time:  811   End Time:  823 (ended session, breakfast arrived)  Start Time:  1015   End Time:  1046      Primary Diagnosis: Speech disturbance [R47.9]  TIA (transient ischemic attack) [G45.9]  Hypokalemia [E87.6]         Insurance: Payor: BLUE CROSS MEDICARE / Plan: [facility] [employer] PLUS / Product Type: Managed Care Medicare /      Precautions: Falls and Impulsive, (R) vision loss.  Weight bearing precautions: None    Orders reviewed, chart reviewed on [facility] Brought. Discussed with Caylee, RN (pt okay to be seen).     ASSESSMENT :  Based on the objective data described below, the patient presents with  - (S)/Mod (I) bed mobility  - (S)functional transfers  - active participation in gait training: 350 feet x2 with rolling walker, assist x (S) cues for obstacle navigation on (R) side due to visual deficit.   - active participation in stair negotiation training: ascend/descend 10 steps (L) handrail CGA  - improving independence with functional mobility  - improving tolerance to therapeutic exercises/activities  - good motivation to participate in PT to return to prior level of function  - Pt enjoys listening to chopin during therapy sessions    - Modified Rankin Score (mRS): 3 - Moderate disability; requires some help but able to walk without (human) assistance - cane, walker etc.    Progression toward goals:  [x]           Improving appropriately and progressing toward goals  []           Improving slowly and progressing toward goals  []           Not making progress toward goals and plan of care will be adjusted      Patient's rehabilitation potential is considered to be Good     Recommendations:  Physical Therapy and Occupational Therapy  Discharge Recommendations: Home with home health PT  Further Equipment Recommendations for Discharge: Walker        PLAN :  Planned Interventions:  Medical laboratory scientific officer Therapeutic exercises Therapeutic activities    Frequency/Duration: Patient will be followed by physical therapy 3-5x/week to address goals.            SUBJECTIVE:   Patient stated, Thank you so much for walking with me, I really enjoyed it.     OBJECTIVE DATA SUMMARY:     Patient found: Bed, Telemetry, Bed alarm, and [employer]  (in AM session), EOB with OT present (in later session).    Pain Assessment before PT session: None observed and None reported  Pain Location:  -  Pain Assessment after PT session: None observed and None reported  Pain Location:  -  []           Yes, patient had pain medications  []           No, Patient has not had pain medications  []           Nurse notified    COGNITIVE STATUS:     Mental Status: person.  Communication: grossly intact.  Follows commands: 1 step.  General Cognition: delayed responses, impaired safety, impulsivity, and impaired problem solving.  Safety/Judgement: Decreased awareness of environment  Decreased awareness of need for assistance  Decreased awareness of need for safety  Decreased insight into  deficits.    Extremities Assessment:      Range Of Motion:  bilateral, lower extremity(s) WFL    Functional Mobility and Balance Status:     Bed mobility:    Supine > sit:   NT    Sit > supine:   (S)/Mod (I) with use of side rail and increased time for BLE management    Transfers:     Sit > stand: (S) verbal cues for proper hand placement    Stand > sit: (S) verbal cues for proper hand placement    Balance:   Static sitting balance:          Good  Dynamic sitting balance:     Good  Static standing balance:      Fair  Dynamic standing balance: Fair (-) unilateral UE support with reaching      Ambulation/Gait Training:  350 feet x2, demo steady, decreased cadence, decreased step height/length, and lateral path deviation towards (R) due to visual deficits , Walker, supervision  Verbal/tactile cues for maintaining straight path and  object/obstacle avoidance on (R) side    Trailed ambulation without AD, pt required CGA and noted reaching for external support (table, bed, wall). Pt demo improved stability when ambulating using FWW    Stair Training:  contact guard up/down 10 steps using (L) handrail; pt demo step-to-step pattern descending and step-over-step pattern ascending  Verbal/tactile cues for proper foot placement    Therapeutic Exercises:      Lower Extremities:  Supine: x10 reps AROM heel slides, hip ABD/ADD, ankle PF/DF, and SLR    Activity Tolerance:   fair+    Final Location:   bed, bed alarm, all needs close, and agrees to call for assistance    COMMUNICATION/EDUCATION:     Education: Ill effects of bedrest, benefits of activity while hospitalized, OOB with assist from staff, call staff for assistance, HEP, positioning, safety, DME, disposition, role of PT, PT plan of care.    Barriers to Learning/Limitations: yes;  cognitive    Please refer to care plan and patient education section for further details.    Thank you for this referral.  Nat FORBES Night, PT

## 2021-10-23 NOTE — Progress Notes (Signed)
Discharge Plan:   Home with HHC, No DMe    Discharge Date:     10/23/2021     Home Health Needed:     Yes    Home Health Agency:    [facility]      Confirmed start of care with the home health agency and spoke with:      Via email Feliberto Harts    Transportation: Rogelia Boga, MD, patient, nurse and facility aware of pickup time    Yes

## 2021-10-23 NOTE — Discharge Summary (Signed)
Discharge Summary       Patient: Amy Sharp Age: 85 y.o. 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 BLACK/[place] female who presents with neurological symptoms  Patient woke up from a nap around 7 PM, had garbled speech that lasted less than 10 minutes  As per family is reported to the paramedics she had laid down for a nap around 6 PM, was at her baseline mental status  Patient is pleasantly confused, has dementia  She has chronic right eye vision loss    Hospital Course by Problem   Patient seen in follow up for multiple medical problems as listed below :     Patient seen in follow up for multiple medical problems as listed below :  Suspct TIA  Hx of  lacunar infarct in the left corona radiata to lentiform in 03/2021  Cont ASA, high dose statin   Neuro check  MRI brian : No acute change   EEG: No seizure   PT/OT/ST   HH recommended  ASA and Statin         Hypokalemia  K replacement  Mag level 1.8       DM2   Diabetes diet   hemoglobin A1c5.1  Actos was  hold since April of this year     Hypertension uncontrolled  On Norvasc 5 mg daily  Monitor BP at home  F/U with PCP to adjust dose     HLD  Cont high intensity statin        Dementia  Mental status at baseline  Continue Aricept and Mamenda     Sinus bradycardia            History of CVA with right-sided deficit and right eye blindness        Continue home regimen for otherwise chronic, stable medical conditions as noted above. Discussed with patient and or family  regarding management, prognosis, treatment and complications of the patient's medical conditions in detail. All questions answered to the satisfaction of those individual(s) who also verbalized understanding of and agreement with the assessment and plan.     Stable to D/C from hospital         Today's examination of the patient revealed:     Objective:   VS: Visit Vitals  BP (!) 152/74   Pulse 63   Temp 97.5 ??F (36.4 ??C)   Resp 16   Ht 5\' 6"  (1.676 m)   Wt 67.3 kg (148 lb 5.9 oz)   SpO2 98%   BMI 23.95 kg/m??      Tmax/24hrs: Temp (24hrs), Avg:97.8 ??F (36.6 ??C), Min:97.3 ??F (36.3 ??C), Max:98.1 ??F (36.7 ??C)     Input/Output:   Intake/Output Summary (Last 24 hours) at 10/23/2021 1003  Last data filed at 10/22/2021 1320  Gross per 24 hour   Intake 220 ml   Output --   Net 220 ml       General  appearance: no distress  Eyes: sclera anicteric  ENT: no oral lesions, thyroid normal  Skin: no spider angiomata, jaundice,   Respiratory: clear to auscultation bilaterally  Cardiovascular: regular heart rate, no murmurs, no JVD  Abdomen: soft, non-tender, no rebound  Extremities: no muscle wasting, no gross arthritic changes  GU: not examined  Neurologic: alert and oriented, cranial nerves grossly intact,         Labs:    Recent Results (from the past 24 hour(s))   MAGNESIUM    Collection Time: 10/22/21  1:26 PM   Result Value Ref Range    Magnesium 1.8 1.6 - 2.6 mg/dL   METABOLIC PANEL, BASIC    Collection Time: 10/23/21  5:11 AM   Result Value Ref Range    Potassium 4.2 3.5 - 5.1 mEq/L    Chloride 117 (H) 98 - 107 mEq/L    Sodium 145 136 - 145 mEq/L    CO2 18 (L) 20 - 31 mEq/L    Glucose 104 74 - 106 mg/dl    BUN 11 9 - 23 mg/dl    Creatinine 1.61 0.96 - 1.02 mg/dl    GFR est AA >04.5      GFR est non-AA >60      Calcium 9.8 8.7 - 10.4 mg/dl    Anion gap 10 5 - 15 mmol/L     Additional Data Reviewed:      XR Results (most recent):  Results from Hospital Encounter encounter on 10/21/21    XR CHEST SNGL V    Narrative  INDICATION:  Stroke. Dyspnea  COMPARISON: 09/07/2021  TECHNIQUE: Single view chest.    Impression  IMPRESSION:  Cardiomegaly. Mild bibasilar linear atelectatic changes. Mild vascular  congestion. No acute infiltrate.    Electronically signed by: Delice Bison, MD 10/21/2021 9:12 PM EST      MRI Results (most recent):  Results from Hospital Encounter encounter on 10/21/21    MRI BRAIN WO CONT    Narrative  EXAM:  MRI BRAIN WO CONT    INDICATION: cva    COMPARISON: 10/21/2021    TECHNIQUE: Multiplanar T1 and T2-weighted images of the brain without IV  contrast. WORKSTATION ID: [id number]    FINDINGS:    No acute intracranial hemorrhage. No midline shift or focal mass identified. No  restricted diffusion.    Mild diffuse parenchymal volume loss. No hydrocephalus. Subcortical,  periventricular and basal ganglia T2 hyperintensities compatible with gliosis  due to several patterns of nonacute ischemic change.    Orbits are unremarkable.  Paranasal sinuses and mastoid air cells are  unremarkable.    Normal flow-void signal within the internal carotid arteries and vertebral  basilar system.    Impression  IMPRESSION:  1. No acute intracranial abnormality.  2. Chronic ischemic changes.    Electronically signed by: Albin Felling, MD 10/22/2021 7:02 PM EST      CT Results (most recent):  Results from Hospital Encounter encounter on 10/21/21    CT HEAD WO CONT    Narrative  INDICATION: speech disturbance. Slurred speech, stroke alert  COMPARISON: Brain MRI 09/08/2021, head CT dated 09/07/2021  TECHNIQUE: Axial Head CT without IV contrast. Sagittal and coronal  reconstructions were performed. All CT exams at this facility use one or more  dose reduction techniques including automatic exposure control, mA/kV adjustment  per patient's size, or iterative reconstruction technique.    FINDINGS:  Some beam hardening artifact obscures detail in the posterior fossa.    Volume: Age-related cerebral  atrophy.    Acute Ischemia: None.    Chronic  Ischemia: Chronic small vessel ischemic changes, moderately advanced for  patient age. Chronic left frontal lobe infarction. Chronic lacunar infarctions  involving the bilateral basal ganglia.    Mass: None    Hemorrhage: No acute intracranial hemorrhage.    Ventricles: No hydrocephalus.    Major arteries, dural sinuses: No hyperdensity to suggest thrombosis.    Calvarium: Intact.    Other discussion: Hyperostosis frontalis of the calvarium.      ASPECTS SCORE: 10    Impression  IMPRESSION:  1.  No acute intracranial abnormalities.    2.  Chronic ischemic changes.    3.  If there is strong clinical suspicion for acute infarction, recommend  follow-up with MRI brain.      Electronically signed by: Delice Bison, MD 10/21/2021 8:06 PM EST           Condition:     Stable  Diet:   Cardiac diet  Disposition:    [] Home   [x] Home with Home Health   [] SNF/NH   [] Rehab   [] Home with family   [] Alternate Facility:____________________      Discharge Medications:        My Medications        START taking these medications        Instructions Each Dose to Equal Morning Noon Evening Bedtime   acetaminophen 325 mg tablet  Commonly known as: TYLENOL    Your last dose was:     Your next dose is:         Take 2 Tablets by mouth every six (6) hours as needed for Pain.   650 mg                 potassium chloride SR 10 mEq tablet  Commonly known as: KLOR-CON 10    Your last dose was:     Your next dose is:         Take 1 Tablet by mouth daily. Indications: low amount of potassium in the blood   10 mEq                        CONTINUE taking these medications        Instructions Each Dose to Equal Morning Noon Evening Bedtime   acetaZOLAMIDE 250 mg tablet  Commonly known as: DIAMOX    Your last dose was:     Your next dose is:         Take 250 mg by mouth two (2) times a day.   250 mg                 amLODIPine 5 mg tablet  Commonly known as: NORVASC    Your last dose was:     Your next dose is:         Take 1 Tablet by mouth daily.   5  mg                 aspirin 81 mg chewable tablet    Your last dose was:     Your next dose is:         Take 81 mg by mouth daily. CVA   81 mg                 atorvastatin 40 mg tablet  Commonly known as: LIPITOR    Your last dose was:  Your next dose is:         Take 40 mg by mouth nightly. Indications: high cholesterol   40 mg                 donepeziL 10 mg tablet  Commonly known as: ARICEPT    Your last dose was:     Your next dose is:         Take 10 mg by mouth once. Indications: mild to moderate Alzheimer's type dementia   10 mg                 memantine 10 mg tablet  Commonly known as: NAMENDA    Your last dose was:     Your next dose is:         Take 10 mg by mouth nightly. Indications: moderate to severe Alzheimer's type dementia   10 mg                 omeprazole 40 mg capsule  Commonly known as: PRILOSEC    Your last dose was:     Your next dose is:         Take 40 mg by mouth daily. Indications: gastroesophageal reflux disease   40 mg                           Where to Get Your Medications        These medications were sent to [id number] [place], [place] - [address] AT [facility] [address]  [address], [place] [address]      Phone: [phone]   acetaminophen 325 mg tablet  potassium chloride SR 10 mEq tablet             Follow-up Appointments and instructions:   Your PCP: Rosalie Doctor, NP, within 3-5 days  Follow-up Information       Follow up With Specialties Details Why Contact Info    Rosalie Doctor, NP Nurse Practitioner   [address]  [place] [address]  [phone]                Consults:     Treatment Team: Treatment Team: Attending Provider: Rosemary Holms, MD; Consulting Provider: Other, Phys, MD; Consulting Provider: Roderic Scarce, MD; Consulting Provider: Rosemary Holms, MD; Speech Language Pathologist: Fulton Mole, SLP; Physical Therapist: Kelvin Cellar, PT; Staff Nurse: Malon Kindle, RN; Occupational Therapy Assistant:  Dimas Aguas, Addison Naegeli; Primary Nurse: Everitt Amber, RN    Significant Diagnostic Studies:       Please follow-up on tests/labs that are still pending:    Thank you Dr Adolm Joseph, Jessy Oto, NP for allowing [place] to participate in the care of Orlean Patten     [facility] medical dictation software was used for portions of this report.  Unintended voice transcription errors may have occurred.)      >42 minutes spent coordinating this discharge (review instructions/follow-up, prescriptions)    Signed:    [facility]  Rosemary Holms, MD  10/23/2021  10:03 AM

## 2021-10-23 NOTE — Progress Notes (Signed)
 [facility]  Face to Face Encounter    Patient's Name: Amy Sharp           Date of Birth: [birth year]    Primary Diagnosis: Speech disturbance [R47.9]  TIA (transient ischemic attack) [G45.9]  Hypokalemia [E87.6]                    Admit Date: 10/21/2021    Date of Face to Face:  October 23, 2021                    Medical Record Number: [id number]     Attending: Onita Pill, MD      Physician Attestation:  To be filled out by physician who conducted Face-to Face encounter.    Current Problem List:  The encounter with the patient was in whole, or in part, for the following medical condition, which is the primary reason home health care (list medical condition):    Patient Active Hospital Problem List:       Speech disturbance (10/21/2021)                  TIA (transient ischemic attack) (10/22/2021)               Hypokalemia (10/22/2021)                                                     Face to Face Encounter findings are related to primary reason for home care:   yes.     1. I certify that the patient needs intermittent care as follows: skilled nursing care:  skilled observation/assessment, patient education  physical therapy: strengthening, stretching/ROM, transfer training, gait/stair training, balance training, and pt/caregiver education  occupational therapy:  ADL safety (ie. cooking, bathing, dressing), ROM, and pt/caregiver education    2. I certify that this patient is homebound, that is: 1) patient requires the use of a walker device, special transportation, or assistance of another to leave the home; or 2) patient's condition makes leaving the home medically contraindicated; and 3) patient has a normal inability to leave the home and leaving the home requires considerable and taxing effort.  Patient may leave the home for infrequent and short duration for medical reasons, and occasional absences for non-medical reasons. Homebound status is due to the following functional limitations:  Patient with strength deficits limiting the performance of all ADL's without caregiver assistance or the use of an assistive device.  Patient with poor safety awareness and is at risk for falls without assistance of another person and the use of an assistive device.  Patient with poor ambulation endurance limiting their safe ability to ascend/descend the required number of steps to leave the home.    3. I certify that this patient is under my care and that I, or a nurse practitioner or physician's assistant, or clinical nurse specialist, or certified nurse midwife, working with me, had a Face-to-Face Encounter that meets the physician Face-to-Face Encounter requirements.  The following are the clinical findings from the Face-to-Face encounter that support the need for skilled services and is a summary of the encounter:     See discharge summary    Electronically signed:  Holli Barrio, RN  10/23/2021      THE FOLLOWING TO BE COMPLETED BY THE  PHYSICIAN:  I concur with the findings described above from the F2F encounter that this patient is homebound and in need of a skilled service.    Electronically signed:  Certifying Physician:Yan, Sergio, MD

## 2022-04-08 DEATH — deceased

## 2024-07-15 ENCOUNTER — Encounter (INDEPENDENT_AMBULATORY_CARE_PROVIDER_SITE_OTHER): Payer: Self-pay | Admitting: *Deleted
